# Patient Record
Sex: Female | Born: 2000 | Hispanic: Yes | Marital: Single | Smoking: Never smoker
Health system: Southern US, Community
[De-identification: ages and names within clinical notes are randomized; demographics above are authoritative.]

## PROBLEM LIST (undated history)

## (undated) DIAGNOSIS — R55 Syncope and collapse: Secondary | ICD-10-CM

## (undated) HISTORY — DX: Syncope and collapse: R55

## (undated) HISTORY — PX: WISDOM TOOTH EXTRACTION: SHX21

---
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
## 2019-03-08 DIAGNOSIS — H109 Unspecified conjunctivitis: Secondary | ICD-10-CM | POA: Diagnosis not present

## 2019-03-08 DIAGNOSIS — R1084 Generalized abdominal pain: Secondary | ICD-10-CM | POA: Diagnosis not present

## 2019-04-23 DIAGNOSIS — H5213 Myopia, bilateral: Secondary | ICD-10-CM | POA: Diagnosis not present

## 2019-06-01 ENCOUNTER — Other Ambulatory Visit: Payer: Self-pay

## 2019-06-01 ENCOUNTER — Ambulatory Visit
Admission: EM | Admit: 2019-06-01 | Discharge: 2019-06-01 | Disposition: A | Discharge status: Home / Self Care | Payer: Medicaid Other | Attending: Internal Medicine | Admitting: Internal Medicine

## 2019-06-01 ENCOUNTER — Encounter: Payer: Self-pay | Admitting: Emergency Medicine

## 2019-06-01 DIAGNOSIS — H5789 Other specified disorders of eye and adnexa: Secondary | ICD-10-CM

## 2019-06-01 MED ORDER — TOBRAMYCIN-DEXAMETHASONE 0.3-0.1 % OP SUSP: 1.0000 [drp] | OPHTHALMIC | 0 refills | Status: AC

## 2019-06-01 NOTE — Discharge Instructions (Signed)
Call your eye Dr On Monday to have a follow up.

## 2019-06-01 NOTE — ED Provider Notes (Signed)
[facility]    CSN: [id number] Arrival date & time: 06/01/19  1401      History   Chief Complaint Chief Complaint  Patient presents with  . Eye Problem    left eye    HPI Lauren Torres is a 18 y.o. female. presents with L red eye, tearing and photophobia x 1 week. Her L upper lid started getting swollen 2 days ago. She wears contacts, but was not wearing them when symptoms started. Has not been able to restart her  monthly contact wear due to redness. She felt like she had something in her eye, so she rinsed it and dag around with her finger but did not find anything. Since then her eye has been worse.     History reviewed. No pertinent past medical history.  There are no active problems to display for this patient.   History reviewed. No pertinent surgical history.  OB History   No obstetric history on file.      Home Medications    Prior to Admission medications   Medication Sig Start Date End Date Taking? Authorizing Provider  dicyclomine (BENTYL) 10 MG capsule Take 1 capsule (10 mg total) by mouth 4 (four) times daily. 03/10/17 03/24/17  Rebecka Apley, MD    Family History Family History  Problem Relation Age of Onset  . Healthy Mother   . Healthy Father     Social History Social History   Tobacco Use  . Smoking status: Never Smoker  . Smokeless tobacco: Never Used  Substance Use Topics  . Alcohol use: No  . Drug use: No     Allergies   Patient has no known allergies.   Review of Systems Review of Systems  Constitutional: Negative for chills and fever.  HENT: Positive for rhinorrhea. Negative for congestion.   Eyes: Positive for photophobia, pain, redness and visual disturbance. Negative for discharge and itching.  Musculoskeletal: Negative for gait problem.  Skin: Negative for rash.  Neurological: Negative for headaches.     Physical Exam Triage Vital Signs ED Triage Vitals  Enc Vitals Group     BP 06/01/19 1426  109/72     Pulse Rate 06/01/19 1426 86     Resp 06/01/19 1426 14     Temp 06/01/19 1426 98.4 F (36.9 C)     Temp Source 06/01/19 1426 Oral     SpO2 06/01/19 1426 98 %     Weight 06/01/19 1423 120 lb (54.4 kg)     Height 06/01/19 1423 5\' 2"  (1.575 m)     Head Circumference --      Peak Flow --      Pain Score 06/01/19 1423 6     Pain Loc --      Pain Edu? --      Excl. in GC? --    No data found.  Updated Vital Signs BP 109/72 (BP Location: Left Arm)   Pulse 86   Temp 98.4 F (36.9 C) (Oral)   Resp 14   Ht 5\' 2"  (1.575 m)   Wt 120 lb (54.4 kg)   LMP 05/18/2019 (Approximate)   SpO2 98%   BMI 21.95 kg/m   Visual Acuity Right Eye Distance:   Left Eye Distance:   Bilateral Distance:    Right Eye Near:   Left Eye Near:    Bilateral Near:     Physical Exam Vitals signs reviewed.  Constitutional:      Appearance: Normal appearance. She  is normal weight.  HENT:     Head: Atraumatic.     Right Ear: External ear normal.     Left Ear: External ear normal.  Eyes:     General: No scleral icterus.       Left eye: No foreign body, discharge or hordeolum.     Conjunctiva/sclera:     Left eye: Left conjunctiva is injected.     Pupils: Pupils are equal, round, and reactive to light.     Left eye: No fluorescein uptake.     Slit lamp exam:    Left eye: Photophobia present. No corneal ulcer or foreign body.     Comments: Has perilimbal erythema. Her L eye lid is a little swollen, but not red.   Neck:     Musculoskeletal: Neck supple.  Pulmonary:     Effort: Pulmonary effort is normal.  Musculoskeletal: Normal range of motion.  Skin:    General: Skin is warm and dry.  Neurological:     Mental Status: She is alert and oriented to person, place, and time.  Psychiatric:        Mood and Affect: Mood normal.        Behavior: Behavior normal.        Thought Content: Thought content normal.        Judgment: Judgment normal.      UC Treatments / Results  Labs (all  labs ordered are listed, but only abnormal results are displayed) Labs Reviewed - No data to display  EKG   Radiology No results found.  Procedures Procedures (including critical care time)  Medications Ordered in UC Medications - No data to display  Initial Impression / Assessment and Plan / UC Course  I have reviewed the triage vital signs and the nursing notes. I explained to her I suspect she had iritis and I placed her on Tobradex as noted. Needs to Fu with her eye Dr On Monday. She was educated of the importance of FU with eye MD. Needs to not wear contracts til back to normal.   Final Clinical Impressions(s) / UC Diagnoses   Final diagnoses:  None   Discharge Instructions   None    ED Prescriptions    None     PDMP not reviewed this encounter.   Shelby Mattocks, PA-C 06/01/19 1711

## 2019-06-01 NOTE — ED Triage Notes (Signed)
Patient c/o pain, swelling and redness in her left eye for a week.  Patient denies fevers.  Patient states that she is unable to open her left eye.

## 2019-06-03 DIAGNOSIS — H2000 Unspecified acute and subacute iridocyclitis: Secondary | ICD-10-CM | POA: Diagnosis not present

## 2019-06-05 DIAGNOSIS — H52223 Regular astigmatism, bilateral: Secondary | ICD-10-CM | POA: Diagnosis not present

## 2019-06-10 DIAGNOSIS — H209 Unspecified iridocyclitis: Secondary | ICD-10-CM | POA: Diagnosis not present

## 2019-07-03 DIAGNOSIS — R319 Hematuria, unspecified: Secondary | ICD-10-CM | POA: Diagnosis not present

## 2019-07-03 DIAGNOSIS — N739 Female pelvic inflammatory disease, unspecified: Secondary | ICD-10-CM | POA: Diagnosis not present

## 2019-07-03 DIAGNOSIS — L988 Other specified disorders of the skin and subcutaneous tissue: Secondary | ICD-10-CM | POA: Diagnosis not present

## 2019-07-18 DIAGNOSIS — Z0000 Encounter for general adult medical examination without abnormal findings: Secondary | ICD-10-CM | POA: Diagnosis not present

## 2019-09-22 ENCOUNTER — Other Ambulatory Visit: Payer: Self-pay

## 2019-09-22 ENCOUNTER — Ambulatory Visit
Admission: EM | Admit: 2019-09-22 | Discharge: 2019-09-22 | Disposition: A | Discharge status: Home / Self Care | Payer: Medicaid Other | Attending: Family Medicine | Admitting: Family Medicine

## 2019-09-22 ENCOUNTER — Encounter: Payer: Self-pay | Admitting: Emergency Medicine

## 2019-09-22 DIAGNOSIS — G8918 Other acute postprocedural pain: Secondary | ICD-10-CM | POA: Diagnosis not present

## 2019-09-22 MED ORDER — OXYCODONE-ACETAMINOPHEN 5-325 MG PO TABS: 1.0000 | ORAL_TABLET | Freq: Three times a day (TID) | ORAL | 0 refills | Status: DC | PRN

## 2019-09-22 NOTE — ED Triage Notes (Signed)
Pt c/o mouth pain. Pain is on the right side of her mouth and her jaw. She states about 4 days ago she had her wisdom teeth removed. She is taking tylenol with codeine that she was given by her oral surgeon but is not helping.

## 2019-09-22 NOTE — ED Provider Notes (Signed)
[facility]    CSN: [id number] Arrival date & time: 09/22/19  1401      History   Chief Complaint Chief Complaint  Patient presents with  . Dental Pain   HPI  19 year old female presents with oral pain.  Patient reports that 4 days ago she had wisdom teeth extraction.  She states that she has been experiencing ongoing postoperative pain.  She has been taking Tylenol with codeine as prescribed without significant improvement.  She has called their office and they advised her that if she was not better that should call back and follow-up with them.  She presents today given continued pain.  Rates her pain as 8/10 in severity.  She reports that she is able to drink although she is not eating very much.  No relieving factors.  No other complaints.  PMH: Stomach ulcer, GERD  Past Surgical History:  Procedure Laterality Date  . WISDOM TOOTH EXTRACTION     OB History   No obstetric history on file.    Home Medications    Prior to Admission medications   Medication Sig Start Date End Date Taking? Authorizing Provider  dicyclomine (BENTYL) 10 MG capsule Take 1 capsule (10 mg total) by mouth 4 (four) times daily. 03/10/17 03/24/17  Rebecka Apley, MD  oxyCODONE-acetaminophen (PERCOCET/ROXICET) 5-325 MG tablet Take 1 tablet by mouth every 8 (eight) hours as needed for moderate pain or severe pain. 09/22/19   Tommie Sams, DO    Family History Family History  Problem Relation Age of Onset  . Healthy Mother   . Healthy Father     Social History Social History   Tobacco Use  . Smoking status: Never Smoker  . Smokeless tobacco: Never Used  Substance Use Topics  . Alcohol use: No  . Drug use: No   Allergies   Patient has no known allergies.  Review of Systems Review of Systems  Constitutional: Positive for appetite change.  HENT:       Oral pain.   Physical Exam Triage Vital Signs ED Triage Vitals  Enc Vitals Group     BP 09/22/19 1418 120/84   Pulse Rate 09/22/19 1418 (!) 101     Resp 09/22/19 1418 18     Temp 09/22/19 1418 100.2 F (37.9 C)     Temp Source 09/22/19 1418 Oral     SpO2 09/22/19 1418 99 %     Weight 09/22/19 1415 122 lb (55.3 kg)     Height 09/22/19 1415 5\' 2"  (1.575 m)     Head Circumference --      Peak Flow --      Pain Score 09/22/19 1415 8     Pain Loc --      Pain Edu? --      Excl. in GC? --    Updated Vital Signs BP 120/84 (BP Location: Left Arm)   Pulse (!) 101   Temp 100.2 F (37.9 C) (Oral)   Resp 18   Ht 5\' 2"  (1.575 m)   Wt 55.3 kg   LMP 09/03/2019 (Approximate)   SpO2 99%   BMI 22.31 kg/m   Visual Acuity Right Eye Distance:   Left Eye Distance:   Bilateral Distance:    Right Eye Near:   Left Eye Near:    Bilateral Near:     Physical Exam Vitals and nursing note reviewed.  Constitutional:      Appearance: She is not toxic-appearing.     Comments: Appears  fatigued.  HENT:     Head: Normocephalic and atraumatic.     Mouth/Throat:     Mouth: Mucous membranes are moist.     Comments: No evidence of dental/oral abscess or infection. Eyes:     General:        Right eye: No discharge.        Left eye: No discharge.     Conjunctiva/sclera: Conjunctivae normal.  Cardiovascular:     Rate and Rhythm: Regular rhythm. Tachycardia present.  Pulmonary:     Effort: Pulmonary effort is normal.     Breath sounds: Normal breath sounds. No wheezing, rhonchi or rales.  Neurological:     Mental Status: She is alert.  Psychiatric:        Mood and Affect: Mood normal.        Behavior: Behavior normal.    UC Treatments / Results  Labs (all labs ordered are listed, but only abnormal results are displayed) Labs Reviewed - No data to display  EKG   Radiology No results found.  Procedures Procedures (including critical care time)  Medications Ordered in UC Medications - No data to display  Initial Impression / Assessment and Plan / UC Course  I have reviewed the triage vital  signs and the nursing notes.  Pertinent labs & imaging results that were available during my care of the patient were reviewed by me and considered in my medical decision making (see chart for details).    19 year old female presents with postoperative pain.  Patient status post recent wisdom tooth extraction.  Persistent pain despite Tylenol 3.  Stopping.  Starting on oxycodone.  Advised to call her surgeon's office tomorrow.  I called the office today and there was no evident way to reach anyone.  Unsure if anyone is on-call.  Advised close to follow-up.  [place] controlled substance database reviewed.  No concerns for abuse at this time.  Final Clinical Impressions(s) / UC Diagnoses   Final diagnoses:  Post-operative pain     Discharge Instructions     Hydrate.  Stop Tylenol # 3.  Oxycodone as needed.  Call surgeon in the AM.  Take care  Dr. Lacinda Axon    ED Prescriptions    Medication Sig [place]. Provider   oxyCODONE-acetaminophen (PERCOCET/ROXICET) 5-325 MG tablet Take 1 tablet by mouth every 8 (eight) hours as needed for moderate pain or severe pain. 10 tablet Thersa Salt G, DO     I have reviewed the PDMP during this encounter.   Coral Spikes, [place] 09/22/19 1513

## 2019-09-22 NOTE — Discharge Instructions (Signed)
Hydrate.  Stop Tylenol # 3.  Oxycodone as needed.  Call surgeon in the AM.  Take care  Dr. Adriana Simas

## 2019-11-18 DIAGNOSIS — Z0000 Encounter for general adult medical examination without abnormal findings: Secondary | ICD-10-CM | POA: Diagnosis not present

## 2019-12-23 DIAGNOSIS — Z03818 Encounter for observation for suspected exposure to other biological agents ruled out: Secondary | ICD-10-CM | POA: Diagnosis not present

## 2020-01-13 DIAGNOSIS — Z20828 Contact with and (suspected) exposure to other viral communicable diseases: Secondary | ICD-10-CM | POA: Diagnosis not present

## 2020-01-13 DIAGNOSIS — Z03818 Encounter for observation for suspected exposure to other biological agents ruled out: Secondary | ICD-10-CM | POA: Diagnosis not present

## 2020-03-04 ENCOUNTER — Ambulatory Visit
Admission: EM | Admit: 2020-03-04 | Discharge: 2020-03-04 | Disposition: A | Discharge status: Home / Self Care | Payer: Medicaid Other | Attending: Emergency Medicine | Admitting: Emergency Medicine

## 2020-03-04 ENCOUNTER — Other Ambulatory Visit: Payer: Self-pay

## 2020-03-04 ENCOUNTER — Encounter: Payer: Self-pay | Admitting: Emergency Medicine

## 2020-03-04 DIAGNOSIS — Z20822 Contact with and (suspected) exposure to covid-19: Secondary | ICD-10-CM | POA: Insufficient documentation

## 2020-03-04 DIAGNOSIS — J029 Acute pharyngitis, unspecified: Secondary | ICD-10-CM

## 2020-03-04 DIAGNOSIS — B349 Viral infection, unspecified: Secondary | ICD-10-CM | POA: Diagnosis not present

## 2020-03-04 LAB — SARS CORONAVIRUS 2 (TAT 6-24 HRS): SARS Coronavirus 2: NEGATIVE

## 2020-03-04 LAB — GROUP A STREP BY PCR: Group A Strep by PCR: NOT DETECTED

## 2020-03-04 LAB — MONONUCLEOSIS SCREEN: Mono Screen: NEGATIVE

## 2020-03-04 MED ORDER — LIDOCAINE VISCOUS HCL 2 % MT SOLN: 15.0000 mL | Freq: Three times a day (TID) | OROMUCOSAL | 0 refills | Status: DC | PRN

## 2020-03-04 MED ORDER — PREDNISONE 20 MG PO TABS: 40.0000 mg | ORAL_TABLET | Freq: Every day | ORAL | 0 refills | Status: DC

## 2020-03-04 NOTE — Discharge Instructions (Signed)
Take medication as prescribed. Rest. Drink plenty of fluids. Monitor.   Follow up with your primary care physician this week as needed. Return to Urgent care or ER for chest pain, shortness of breath, difficulty swallowing, new or worsening concerns.

## 2020-03-04 NOTE — ED Provider Notes (Addendum)
[facility] ____________________________________________  Time seen: Approximately 2:13 PM  I have reviewed the triage vital signs and the nursing notes.   HISTORY  Chief Complaint Headache and Sore Throat  HPI Lauren Torres is a 19 y.o. female present for evaluation of sore throat for the last 3 days.  Reports woke up with sore throat, congestion and some coughing on Sunday and symptoms have continued.  States she has had painful swallowing and not eating quite as much.  Some intermittent lightheadedness.  Overall continues to drink fluids well.  States nasal congestion and runny nose.  Has had occasional episode of chest pain that last for a few seconds and then resolves.  Denies any current chest pain.  Denies shortness of breath.  Denies known sick contacts.  Does not share drinks after others.  Denies known fevers.  Has had both COVID-19 vaccines, last in February.  Denies other aggravating alleviating factors.  Denies recent sickness.  Patient's last menstrual period was 02/13/2020. Denies pregnancy.  Nira Retort : PCP    History reviewed. No pertinent past medical history.  There are no problems to display for this patient.   Past Surgical History:  Procedure Laterality Date   WISDOM TOOTH EXTRACTION       No current facility-administered medications for this encounter.  Current Outpatient Medications:    lidocaine (XYLOCAINE) 2 % solution, Use as directed 15 mLs in the mouth or throat every 8 (eight) hours as needed (sore throat. gargle and spit as needed for sore throat.)., Disp: 100 mL, Rfl: 0   predniSONE (DELTASONE) 20 MG tablet, Take 2 tablets (40 mg total) by mouth daily., Disp: 10 tablet, Rfl: 0  Allergies Patient has no known allergies.  Family History  Problem Relation Age of Onset   Healthy Mother    Healthy Father     Social History Social History   Tobacco Use   Smoking status: Never Smoker   Smokeless tobacco:  Never Used  Building services engineer Use: Never used  Substance Use Topics   Alcohol use: No   Drug use: No    Review of Systems Constitutional: No fever. ENT: Positive sore throat. Positive congestion.  Cardiovascular:AS above.  Respiratory: Denies shortness of breath. Gastrointestinal: No abdominal pain.  No nausea, no vomiting.  No diarrhea.   Skin: Negative for rash.  ____________________________________________   PHYSICAL EXAM:  VITAL SIGNS: ED Triage Vitals  Enc Vitals Group     BP 03/04/20 1325 108/81     Pulse Rate 03/04/20 1325 64     Resp 03/04/20 1325 18     Temp 03/04/20 1325 98.1 F (36.7 C)     Temp Source 03/04/20 1325 Oral     SpO2 03/04/20 1325 100 %     Weight 03/04/20 1321 128 lb (58.1 kg)     Height 03/04/20 1321 5\' 3"  (1.6 m)     Head Circumference --      Peak Flow --      Pain Score 03/04/20 1321 6     Pain Loc --      Pain Edu? --      Excl. in GC? --     Constitutional: Alert and oriented. Well appearing and in no acute distress. Eyes: Conjunctivae are normal.  Head: Atraumatic. No swelling. No erythema.  Ears: no erythema, normal TMs bilaterally.   Nose:Nasal congestion  Mouth/Throat: Mucous membranes are moist. Mild pharyngeal erythema. 2-3+ bilateral tonsillar swelling.  No exudate.  No  uvular shift or deviation. Neck: No stridor.  No cervical spine tenderness to palpation. Hematological/Lymphatic/Immunilogical: Anterior bilateral cervical lymphadenopathy. Cardiovascular: Normal rate, regular rhythm. Grossly normal heart sounds. Good peripheral circulation. Respiratory: Normal respiratory effort. No retractions. No wheezes, rales or rhonchi. Good air movement.  Musculoskeletal: Ambulatory with steady gait.  Neurologic:  Normal speech and language. No gait instability. Skin:  Skin appears warm, dry and intact. No rash noted. Psychiatric: Mood and affect are normal. Speech and behavior are  normal.  ___________________________________________   LABS (all labs ordered are listed, but only abnormal results are displayed)  Labs Reviewed  GROUP A STREP BY PCR  SARS CORONAVIRUS 2 (TAT 6-24 HRS)  MONONUCLEOSIS SCREEN   ____________________________________________  EKG  ED ECG REPORT I, Renford Dills, the attending physician, personally viewed and interpreted this ECG.   Date: 03/04/2020  EKG Time:1416  Rate: 60  Rhythm: Normal sinus rhythm.  ST&T Change: No ST elevation noted.  RADIOLOGY  No results found. ____________________________________________   PROCEDURES Procedures    INITIAL IMPRESSION / ASSESSMENT AND PLAN / ED COURSE  Pertinent labs & imaging results that were available during my care of the patient were reviewed by me and considered in my medical decision making (see chart for details).  Overall well-appearing patient.  No acute distress.  Suspect viral illness.  Strep negative.  COVID-19 testing ordered.  Mono negative.  Suspect viral.  Supportive care with prednisone and as needed viscous lidocaine gargles.  Over-the-counter cough congestion medication as needed.  Rest.  Work note given for today and tomorrow pending negative Covid test.  Discussed very strict follow-up and return parameters including chest pain, shortness of breath, difficulty swallowing or worsening concerns.Discussed indication, risks and benefits of medications with patient.   Discussed follow up with Primary care physician this week. Discussed follow up and return parameters including no resolution or any worsening concerns. Patient verbalized understanding and agreed to plan.   ____________________________________________   FINAL CLINICAL IMPRESSION(S) / ED DIAGNOSES  Final diagnoses:  Pharyngitis, unspecified etiology  Viral illness     ED Discharge Orders         Ordered    lidocaine (XYLOCAINE) 2 % solution  Every 8 hours PRN     Discontinue  Reprint      03/04/20 1454    predniSONE (DELTASONE) 20 MG tablet  Daily     Discontinue  Reprint     03/04/20 1454           Note: This dictation was prepared with Dragon dictation along with smaller phrase technology. Any transcriptional errors that result from this process are unintentional.         Renford Dills, NP 03/04/20 1501

## 2020-03-04 NOTE — ED Triage Notes (Signed)
Pt c/o headache, sore throat and dizziness. Started about 3 days ago. Denies fever.

## 2020-03-14 DIAGNOSIS — Z03818 Encounter for observation for suspected exposure to other biological agents ruled out: Secondary | ICD-10-CM | POA: Diagnosis not present

## 2020-03-14 DIAGNOSIS — Z20822 Contact with and (suspected) exposure to covid-19: Secondary | ICD-10-CM | POA: Diagnosis not present

## 2021-01-11 ENCOUNTER — Other Ambulatory Visit: Payer: Self-pay

## 2021-01-11 ENCOUNTER — Ambulatory Visit (LOCAL_COMMUNITY_HEALTH_CENTER): Payer: Self-pay

## 2021-01-11 DIAGNOSIS — Z111 Encounter for screening for respiratory tuberculosis: Secondary | ICD-10-CM

## 2021-01-11 NOTE — Progress Notes (Signed)
Patient reports ~2 weeks ago had PPD at [facility]. She stated they told her results were inconclusive???  Here today for repeat PPD.  Per [facility] EMR 11/12/2018 had 98mm reading at [facility]. Richmond Campbell, RN

## 2021-01-14 ENCOUNTER — Ambulatory Visit (LOCAL_COMMUNITY_HEALTH_CENTER): Payer: Medicaid Other

## 2021-01-14 ENCOUNTER — Other Ambulatory Visit: Payer: Self-pay

## 2021-01-14 DIAGNOSIS — Z111 Encounter for screening for respiratory tuberculosis: Secondary | ICD-10-CM

## 2021-01-14 LAB — TB SKIN TEST
Induration: 0 mm
TB Skin Test: NEGATIVE

## 2021-03-03 ENCOUNTER — Other Ambulatory Visit: Payer: Self-pay

## 2021-03-03 ENCOUNTER — Ambulatory Visit
Admission: EM | Admit: 2021-03-03 | Discharge: 2021-03-03 | Disposition: A | Discharge status: Home / Self Care | Payer: Medicaid Other | Attending: Sports Medicine | Admitting: Sports Medicine

## 2021-03-03 DIAGNOSIS — W57XXXA Bitten or stung by nonvenomous insect and other nonvenomous arthropods, initial encounter: Secondary | ICD-10-CM

## 2021-03-03 DIAGNOSIS — R220 Localized swelling, mass and lump, head: Secondary | ICD-10-CM | POA: Diagnosis not present

## 2021-03-03 DIAGNOSIS — S00561A Insect bite (nonvenomous) of lip, initial encounter: Secondary | ICD-10-CM

## 2021-03-03 DIAGNOSIS — B001 Herpesviral vesicular dermatitis: Secondary | ICD-10-CM | POA: Diagnosis not present

## 2021-03-03 DIAGNOSIS — T7840XA Allergy, unspecified, initial encounter: Secondary | ICD-10-CM | POA: Diagnosis not present

## 2021-03-03 MED ORDER — METHYLPREDNISOLONE SODIUM SUCC 40 MG IJ SOLR
80.0000 mg | Freq: Once | INTRAMUSCULAR | Status: AC
  Administered 2021-03-03: 80 mg via INTRAMUSCULAR

## 2021-03-03 MED ORDER — PREDNISONE 5 MG PO TABS: 5.0000 mg | ORAL_TABLET | Freq: Every day | ORAL | 0 refills | Status: DC

## 2021-03-03 MED ORDER — ACYCLOVIR 5 % EX OINT: 1.0000 "application " | TOPICAL_OINTMENT | CUTANEOUS | 0 refills | Status: DC

## 2021-03-03 NOTE — Discharge Instructions (Addendum)
As we discussed, you have a swollen lip that may be secondary to an insect bite.  I have treated you with a steroid injection today.  I also called in a steroid taper that you will start tomorrow and you can get from your pharmacy to which she will take orally. This may be a viral process or a cold sore.  I have sent in a topical ointment that you can use over that area as directed. Please follow-up with your primary care provider if symptoms persist. Please see educational handouts. If symptoms worsen or you develop any chest pain, shortness of breath, throat tightening, or any other concerning symptoms then please call 911 or go to the ER.

## 2021-03-03 NOTE — ED Triage Notes (Signed)
Pt states she was initially bit by unknown insect on upper lip 4 days ago.  Has applied cortisone creme and ice but continues to swell.  Feels some burning and stinging on upper lip.  Swelling limited to lip, no mouth or airway involvement. Some pain with talking or drinking/eating.

## 2021-03-04 NOTE — ED Provider Notes (Signed)
[facility]    CSN: [id number] Arrival date & time: 03/03/21  1129      History   Chief Complaint Chief Complaint  Patient presents with   Insect Bite    HPI Lauren Torres is a 20 y.o. female.   Patient is a 20 year old female who presents for evaluation of upper lip swelling for about 4 days now.  She reports that she felt a sting when she was putting on her mask on February 27, 2021.  She did not see an insect or a wasp or bee sting her.  There was no actual witnessed event.  She has been using some topical hydrocortisone cream and icing but the swelling continues.  She feels a little bit of burning and stinging in her upper lip.  She denies any sore throat, difficulty breathing, wheezing, or difficulty swallowing.  No fever shakes chills.  No nausea vomiting or diarrhea.  No history of asthma or seasonal allergies.  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  Her primary care physician is [facility] in [place] but she did not get a chance to get into see them.  No chest pain or shortness of breath.  No red flag signs or symptoms.   History reviewed. No pertinent past medical history.  There are no problems to display for this patient.   Past Surgical History:  Procedure Laterality Date   WISDOM TOOTH EXTRACTION      OB History   No obstetric history on file.      Home Medications    Prior to Admission medications   Medication Sig Start Date End Date Taking? Authorizing Provider  acyclovir ointment (ZOVIRAX) 5 % Apply 1 application topically every 3 (three) hours. 03/03/21  Yes Delton See, MD  predniSONE (DELTASONE) 5 MG tablet Take 1 tablet (5 mg total) by mouth daily with breakfast. 6 tablets on day 1, 5 tablets on day 2, 4 tablets on day 3, 3 tablets on day 4, 2 tablets on day 5, and 1 tablet on day 6.  Do not start until March 04, 2021 03/03/21  Yes Delton See, MD  lidocaine (XYLOCAINE) 2 % solution Use as  directed 15 mLs in the mouth or throat every 8 (eight) hours as needed (sore throat. gargle and spit as needed for sore throat.). 03/04/20   Renford Dills, NP  dicyclomine (BENTYL) 10 MG capsule Take 1 capsule (10 mg total) by mouth 4 (four) times daily. 03/10/17 03/04/20  Rebecka Apley, MD    Family History Family History  Problem Relation Age of Onset   Healthy Mother    Healthy Father     Social History Social History   Tobacco Use   Smoking status: Never   Smokeless tobacco: Never  Vaping Use   Vaping Use: Never used  Substance Use Topics   Alcohol use: No   Drug use: No     Allergies   Patient has no known allergies.   Review of Systems Review of Systems  Constitutional:  Negative for activity change, appetite change, chills, diaphoresis, fatigue and fever.  HENT:  Positive for facial swelling. Negative for congestion, ear pain, postnasal drip, rhinorrhea, sinus pressure, sinus pain, sneezing and sore throat.        Swelling of the upper lip without evidence of angioedema.  Eyes:  Negative for pain.  Respiratory:  Negative for cough, chest tightness and shortness of breath.   Cardiovascular:  Negative for chest pain and palpitations.  Gastrointestinal:  Negative for abdominal pain, diarrhea, nausea and vomiting.  Genitourinary:  Negative for dysuria.  Musculoskeletal:  Negative for back pain, myalgias and neck pain.  Skin:  Negative for color change, pallor, rash and wound.  Neurological:  Positive for numbness. Negative for dizziness, light-headedness and headaches.  All other systems reviewed and are negative.   Physical Exam Triage Vital Signs ED Triage Vitals  Enc Vitals Group     BP 03/03/21 1203 104/75     Pulse Rate 03/03/21 1203 68     Resp 03/03/21 1203 18     Temp 03/03/21 1203 (!) 97.5 F (36.4 C)     Temp Source 03/03/21 1203 Oral     SpO2 03/03/21 1203 97 %     Weight --      Height --      Head Circumference --      Peak Flow --       Pain Score 03/03/21 1201 0     Pain Loc --      Pain Edu? --      Excl. in GC? --    No data found.  Updated Vital Signs BP 104/75 (BP Location: Left Arm)   Pulse 68   Temp (!) 97.5 F (36.4 C) (Oral) Comment: difficulty holding in mouth d/t lip swelling/pain  Resp 18   LMP 02/08/2021 (Exact Date)   SpO2 97%   Visual Acuity Right Eye Distance:   Left Eye Distance:   Bilateral Distance:    Right Eye Near:   Left Eye Near:    Bilateral Near:     Physical Exam Vitals and nursing note reviewed.  Constitutional:      General: She is not in acute distress.    Appearance: Normal appearance. She is not ill-appearing, toxic-appearing or diaphoretic.  HENT:     Head: Normocephalic and atraumatic.     Comments: Patient has some swelling of the upper lip in the midline.  There is some tenderness to palpation.  No evidence of angioedema.  It is consistent with an insect bite or sting.  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    Nose: Nose normal.     Mouth/Throat:     Mouth: Mucous membranes are moist.  Eyes:     General: No scleral icterus.       Right eye: No discharge.        Left eye: No discharge.     Extraocular Movements: Extraocular movements intact.     Conjunctiva/sclera: Conjunctivae normal.     Pupils: Pupils are equal, round, and reactive to light.  Cardiovascular:     Rate and Rhythm: Normal rate and regular rhythm.     Pulses: Normal pulses.     Heart sounds: Normal heart sounds. No murmur heard.   No friction rub. No gallop.  Pulmonary:     Effort: Pulmonary effort is normal.     Breath sounds: Normal breath sounds. No stridor. No wheezing, rhonchi or rales.  Musculoskeletal:     Cervical back: Normal range of motion and neck supple.  Skin:    General: Skin is warm and dry.     Capillary Refill: Capillary refill takes less than 2 seconds.  Neurological:     General: No focal deficit present.      Mental Status: She is alert and oriented to person, place, and time.  UC Treatments / Results  Labs (all labs ordered are listed, but only abnormal results are displayed) Labs Reviewed - No data to display  EKG   Radiology No results found.  Procedures Procedures (including critical care time)  Medications Ordered in UC Medications  methylPREDNISolone sodium succinate (SOLU-MEDROL) 40 mg/mL injection 80 mg (80 mg Intramuscular Given 03/03/21 1247)    Initial Impression / Assessment and Plan / UC Course  I have reviewed the triage vital signs and the nursing notes.  Pertinent labs & imaging results that were available during my care of the patient were reviewed by me and considered in my medical decision making (see chart for details).  Clinical impression: 1.  Insect bite to the upper lip with swelling. 2.  Allergic reaction 3.  Cold sore  Treatment plan: 1.  The findings and treatment plan were discussed in detail with the patient.  Patient was in agreement. 2.  Micah Flesher ahead and treated her with a steroid injection here in the office today.  Gave her 80 mg of IM Solu-Medrol.  She tolerated this without incident. 3.  She will start oral steroids tomorrow and give her a Dosepak. 4.  For the cold sore I did prescribe acyclovir ointment as scheduled. 5.  Educational handouts provided. 6.  Over-the-counter meds as needed. 7.  If symptoms persist I have asked her to see her PCP. 8.  If she developed worsening symptoms including chest pain, shortness of breath, throat tightening or any other concerning symptoms that she needed to call 911 and go to the ER.  She voiced verbal understanding. 9.  She was stable upon discharge and will follow-up here as needed.    Final Clinical Impressions(s) / UC Diagnoses   Final diagnoses:  Insect bite of lip, initial encounter  Swelling of upper lip  Allergic reaction, initial encounter  Cold sore     Discharge Instructions      As  we discussed, you have a swollen lip that may be secondary to an insect bite.  I have treated you with a steroid injection today.  I also called in a steroid taper that you will start tomorrow and you can get from your pharmacy to which she will take orally. This may be a viral process or a cold sore.  I have sent in a topical ointment that you can use over that area as directed. Please follow-up with your primary care provider if symptoms persist. Please see educational handouts. If symptoms worsen or you develop any chest pain, shortness of breath, throat tightening, or any other concerning symptoms then please call 911 or go to the ER.     ED Prescriptions     Medication Sig Dispense Auth. Provider   acyclovir ointment (ZOVIRAX) 5 % Apply 1 application topically every 3 (three) hours. 15 g Delton See, MD   predniSONE (DELTASONE) 5 MG tablet Take 1 tablet (5 mg total) by mouth daily with breakfast. 6 tablets on day 1, 5 tablets on day 2, 4 tablets on day 3, 3 tablets on day 4, 2 tablets on day 5, and 1 tablet on day 6.  Do not start until March 04, 2021 21 tablet Delton See, MD      PDMP not reviewed this encounter.   Delton See, MD 03/04/21 1950

## 2021-05-20 ENCOUNTER — Encounter: Payer: Self-pay | Admitting: Emergency Medicine

## 2021-05-20 ENCOUNTER — Other Ambulatory Visit: Payer: Self-pay

## 2021-05-20 ENCOUNTER — Ambulatory Visit
Admission: EM | Admit: 2021-05-20 | Discharge: 2021-05-20 | Disposition: A | Discharge status: Home / Self Care | Payer: Medicaid Other | Attending: Emergency Medicine | Admitting: Emergency Medicine

## 2021-05-20 DIAGNOSIS — U071 COVID-19: Secondary | ICD-10-CM

## 2021-05-20 LAB — URINALYSIS, COMPLETE (UACMP) WITH MICROSCOPIC
Bilirubin Urine: NEGATIVE
Glucose, UA: NEGATIVE mg/dL
Ketones, ur: NEGATIVE mg/dL
Leukocytes,Ua: NEGATIVE
Nitrite: NEGATIVE
Protein, ur: NEGATIVE mg/dL
Specific Gravity, Urine: 1.02 (ref 1.005–1.030)
pH: 7.5 (ref 5.0–8.0)

## 2021-05-20 LAB — RESP PANEL BY RT-PCR (FLU A&B, COVID) ARPGX2
Influenza A by PCR: NEGATIVE
Influenza B by PCR: NEGATIVE
SARS Coronavirus 2 by RT PCR: POSITIVE — AB

## 2021-05-20 LAB — GROUP A STREP BY PCR: Group A Strep by PCR: NOT DETECTED

## 2021-05-20 MED ORDER — IBUPROFEN 600 MG PO TABS: 600.0000 mg | ORAL_TABLET | Freq: Four times a day (QID) | ORAL | 0 refills | Status: DC | PRN

## 2021-05-20 MED ORDER — BENZONATATE 200 MG PO CAPS: 200.0000 mg | ORAL_CAPSULE | Freq: Three times a day (TID) | ORAL | 0 refills | Status: DC | PRN

## 2021-05-20 MED ORDER — ACETAMINOPHEN 325 MG PO TABS
650.0000 mg | ORAL_TABLET | Freq: Once | ORAL | Status: AC
  Administered 2021-05-20: 650 mg via ORAL

## 2021-05-20 NOTE — ED Provider Notes (Signed)
HPI  SUBJECTIVE:  Lauren Torres is a 20 y.o. female who presents with 5 days of sore throat.  She reports fevers to 101.5 starting today with headache, body aches, nasal congestion, rhinorrhea, cough, shortness of breath, nausea, mild abdominal pain.  She reports a raspy voice.  No loss of sense of smell or taste, vomiting, diarrhea, rash, drooling, trismus, sensation of throat swelling shut, neck stiffness.  He was exposed to strep 6 days ago.  No known flu or COVID exposure.  She got the COVID booster.  She also states that urination "feels weird" and reports cloudy urine for the past 2 days.  No other urinary complaints.  No antibiotics in the past month.  No antipyretic in the past 6 hours.  She tried tea which made her sore throat worse.  No alleviating factors.  Sore throat is worse with talking and swallowing.  She has a past medical history of UTI, COVID in January 2022.  LMP: 9/11.  Denies the possibility of being pregnant.  PMD: [facility] [place]   History reviewed. No pertinent past medical history.  Past Surgical History:  Procedure Laterality Date   WISDOM TOOTH EXTRACTION      Family History  Problem Relation Age of Onset   Healthy Mother    Healthy Father     Social History   Tobacco Use   Smoking status: Never   Smokeless tobacco: Never  Vaping Use   Vaping Use: Never used  Substance Use Topics   Alcohol use: No   Drug use: No    No current facility-administered medications for this encounter.  Current Outpatient Medications:    benzonatate (TESSALON) 200 MG capsule, Take 1 capsule (200 mg total) by mouth 3 (three) times daily as needed for cough., Disp: 30 capsule, Rfl: 0   ibuprofen (ADVIL) 600 MG tablet, Take 1 tablet (600 mg total) by mouth every 6 (six) hours as needed., Disp: 30 tablet, Rfl: 0  No Known Allergies   ROS  As noted in HPI.   Physical Exam  BP 109/74 (BP Location: Right Arm)   Pulse (!) 120   Temp (!) 101.5 F (38.6 C)  (Oral)   Resp 18   LMP 05/09/2021   SpO2 97%   Constitutional: Well developed, well nourished, no acute distress Eyes: PERRL, EOMI, conjunctiva normal bilaterally HENT: Normocephalic, atraumatic,mucus membranes moist.  Erythematous, swollen, almost touching tonsils without exudates.  Airway patent.  No muffled voice, drooling, trismus, stridor.  Erythematous, swollen turbinates right side.  Positive nasal congestion right side.  Positive frontal, maxillary sinus tenderness. Neck: Positive anterior cervical adenopathy Respiratory: Clear to auscultation bilaterally, no rales, no wheezing, no rhonchi Cardiovascular: Regular tachycardia, no murmurs, no gallops, no rubs GI: Soft, nondistended, normal bowel sounds, positive suprapubic, flank tenderness, no rebound, no guarding.  No splenomegaly Back: Questionable left CVAT skin: No rash, skin intact Musculoskeletal: No edema, no tenderness, no deformities Neurologic: Alert & oriented x 3, CN III-XII grossly intact, no motor deficits, sensation grossly intact Psychiatric: Speech and behavior appropriate   ED Course   Medications  acetaminophen (TYLENOL) tablet 650 mg (650 mg Oral Given 05/20/21 1228)    Orders Placed This Encounter  Procedures   Resp Panel by RT-PCR (Flu A&B, Covid) Nasopharyngeal Swab    Standing Status:   Standing    Number of Occurrences:   1    Order Specific Question:   Is this test for diagnosis or screening    Answer:   Diagnosis  of ill patient    Order Specific Question:   Symptomatic for COVID-19 as defined by CDC    Answer:   Yes    Order Specific Question:   Date of Symptom Onset    Answer:   05/16/2021    Order Specific Question:   Hospitalized for COVID-19    Answer:   No    Order Specific Question:   Admitted to ICU for COVID-19    Answer:   No    Order Specific Question:   Previously tested for COVID-19    Answer:   Unknown    Order Specific Question:   Resident in a congregate (group) care setting     Answer:   No    Order Specific Question:   Employed in healthcare setting    Answer:   No    Order Specific Question:   Pregnant    Answer:   No    Order Specific Question:   Has patient completed COVID vaccination(s) (2 doses of Pfizer/Moderna 1 dose of [employer])    Answer:   Yes    Order Specific Question:   Has patient completed COVID Booster / 3rd dose    Answer:   Yes   Group A Strep by PCR    Standing Status:   Standing    Number of Occurrences:   1   Urinalysis, Complete w Microscopic Urine, Clean Catch    Standing Status:   Standing    Number of Occurrences:   1   Airborne and Contact precautions    Standing Status:   Standing    Number of Occurrences:   1   Results for orders placed or performed during the hospital encounter of 05/20/21 (from the past 24 hour(s))  Resp Panel by RT-PCR (Flu A&B, Covid) Nasopharyngeal Swab     Status: Abnormal   Collection Time: 05/20/21 12:24 PM   Specimen: Nasopharyngeal Swab; Nasopharyngeal(NP) swabs in vial transport medium  Result Value Ref Range   SARS Coronavirus 2 by RT PCR POSITIVE (A) NEGATIVE   Influenza A by PCR NEGATIVE NEGATIVE   Influenza B by PCR NEGATIVE NEGATIVE  Group A Strep by PCR     Status: None   Collection Time: 05/20/21 12:35 PM   Specimen: Throat; Sterile Swab  Result Value Ref Range   Group A Strep by PCR NOT DETECTED NOT DETECTED  Urinalysis, Complete w Microscopic Urine, Clean Catch     Status: Abnormal   Collection Time: 05/20/21 12:46 PM  Result Value Ref Range   Color, Urine YELLOW YELLOW   APPearance CLEAR CLEAR   Specific Gravity, Urine 1.020 1.005 - 1.030   pH 7.5 5.0 - 8.0   Glucose, UA NEGATIVE NEGATIVE mg/dL   Hgb urine dipstick SMALL (A) NEGATIVE   Bilirubin Urine NEGATIVE NEGATIVE   Ketones, ur NEGATIVE NEGATIVE mg/dL   Protein, ur NEGATIVE NEGATIVE mg/dL   Nitrite NEGATIVE NEGATIVE   Leukocytes,Ua NEGATIVE NEGATIVE   Squamous Epithelial / LPF 6-10 0 - 5   WBC, UA 0-5 0 - 5  WBC/hpf   RBC / HPF 6-10 0 - 5 RBC/hpf   Bacteria, UA RARE (A) NONE SEEN   No results found.  ED Clinical Impression  1. COVID-19 virus infection      ED Assessment/Plan  Sore throat.  Checking strep PCR, COVID, flu because of the fever.  On further history, she has some urinary complaints and she has suprapubic, flank tenderness.  Also checking a  urine.  COVID-positive.  Strep negative.  Contaminated urine specimen, but does not appear to be a UTI.  She has no comorbidities and does not qualify for antiviral treatment unfortunately.  Home with Tylenol/ibuprofen and Tessalon.  Discussed labs, MDM, treatment plan, and plan for follow-up with patient Discussed sn/sx that should prompt return to the ED. patient agrees with plan.   Meds ordered this encounter  Medications   acetaminophen (TYLENOL) tablet 650 mg   benzonatate (TESSALON) 200 MG capsule    Sig: Take 1 capsule (200 mg total) by mouth 3 (three) times daily as needed for cough.    Dispense:  30 capsule    Refill:  0   ibuprofen (ADVIL) 600 MG tablet    Sig: Take 1 tablet (600 mg total) by mouth every 6 (six) hours as needed.    Dispense:  30 tablet    Refill:  0     *This clinic note was created using Scientist, clinical (histocompatibility and immunogenetics). Therefore, there may be occasional mistakes despite careful proofreading. ?    Domenick Gong, MD 05/20/21 [phone]

## 2021-05-20 NOTE — ED Triage Notes (Signed)
Pt c/o nasal congestion, headache, sore throat cough and body aches x 5 days.

## 2021-05-20 NOTE — Discharge Instructions (Addendum)
Your COVID was positive.  Your flu, strep, urine were all negative.  You may take 600 mg of ibuprofen combined with 1000 mg of Tylenol together 3-4 times a day as needed for pain, fevers, body aches, headaches.  Continue pushing plenty of electrolyte containing fluids such as Pedialyte or Gatorade.  Tessalon for the cough.

## 2021-11-18 ENCOUNTER — Other Ambulatory Visit: Payer: Self-pay

## 2021-11-18 ENCOUNTER — Ambulatory Visit
Admission: EM | Admit: 2021-11-18 | Discharge: 2021-11-18 | Disposition: A | Discharge status: Home / Self Care | Payer: Medicaid Other | Attending: Internal Medicine | Admitting: Internal Medicine

## 2021-11-18 ENCOUNTER — Encounter: Payer: Self-pay | Admitting: Emergency Medicine

## 2021-11-18 DIAGNOSIS — N3000 Acute cystitis without hematuria: Secondary | ICD-10-CM | POA: Diagnosis not present

## 2021-11-18 LAB — URINALYSIS, ROUTINE W REFLEX MICROSCOPIC
Bilirubin Urine: NEGATIVE
Glucose, UA: NEGATIVE mg/dL
Ketones, ur: NEGATIVE mg/dL
Nitrite: NEGATIVE
Protein, ur: NEGATIVE mg/dL
Specific Gravity, Urine: 1.025 (ref 1.005–1.030)
pH: 7 (ref 5.0–8.0)

## 2021-11-18 LAB — URINALYSIS, MICROSCOPIC (REFLEX)

## 2021-11-18 LAB — PREGNANCY, URINE: Preg Test, Ur: NEGATIVE

## 2021-11-18 MED ORDER — NITROFURANTOIN MONOHYD MACRO 100 MG PO CAPS: 100.0000 mg | ORAL_CAPSULE | Freq: Two times a day (BID) | ORAL | 0 refills | Status: DC

## 2021-11-18 MED ORDER — PHENAZOPYRIDINE HCL 200 MG PO TABS: 200.0000 mg | ORAL_TABLET | Freq: Three times a day (TID) | ORAL | 0 refills | Status: DC

## 2021-11-18 NOTE — ED Triage Notes (Signed)
Pt c/o dysuria, and urinary frequency. Started about 3 days ago.  ?

## 2021-11-18 NOTE — ED Provider Notes (Signed)
?[facility] ? ? ? ?CSN: [id number] ?Arrival date & time: 11/18/21  1858 ? ? ?  ? ?History   ?Chief Complaint ?Chief Complaint  ?Patient presents with  ? Urinary Frequency  ? Dysuria  ? ? ?HPI ?Lauren Torres is a 21 y.o. female who developed urinary frequency 2 days ago, and since yesterday dysuria. Denies abnormal vaginal discharge. Her periods have been irregular since Jan this year.  ? ? ? ?History reviewed. No pertinent past medical history. ? ?There are no problems to display for this patient. ? ? ?Past Surgical History:  ?Procedure Laterality Date  ? WISDOM TOOTH EXTRACTION    ? ? ?OB History   ?No obstetric history on file. ?  ? ? ? ?Home Medications   ? ?Prior to Admission medications   ?Medication Sig Start Date End Date Taking? Authorizing Provider  ?nitrofurantoin, macrocrystal-monohydrate, (MACROBID) 100 MG capsule Take 1 capsule (100 mg total) by mouth 2 (two) times daily. 11/18/21  Yes Rodriguez-Southworth, Nettie Elm, PA-C  ?phenazopyridine (PYRIDIUM) 200 MG tablet Take 1 tablet (200 mg total) by mouth 3 (three) times daily. 11/18/21  Yes Rodriguez-Southworth, Nettie Elm, PA-C  ?dicyclomine (BENTYL) 10 MG capsule Take 1 capsule (10 mg total) by mouth 4 (four) times daily. 03/10/17 03/04/20  Rebecka Apley, MD  ? ? ?Family History ?Family History  ?Problem Relation Age of Onset  ? Healthy Mother   ? Healthy Father   ? ? ?Social History ?Social History  ? ?Tobacco Use  ? Smoking status: Never  ? Smokeless tobacco: Never  ?Vaping Use  ? Vaping Use: Never used  ?Substance Use Topics  ? Alcohol use: No  ? Drug use: No  ? ? ? ?Allergies   ?Patient has no known allergies. ? ? ?Review of Systems ?Review of Systems  ?Constitutional:  Negative for chills and fever.  ?Genitourinary:  Positive for dysuria, frequency and menstrual problem. Negative for flank pain, urgency and vaginal discharge.  ? ? ?Physical Exam ?Triage Vital Signs ?ED Triage Vitals  ?Enc Vitals Group  ?   BP 11/18/21 1915 115/74  ?    Pulse Rate 11/18/21 1915 89  ?   Resp 11/18/21 1915 18  ?   Temp 11/18/21 1915 98.3 ?F (36.8 ?C)  ?   Temp Source 11/18/21 1915 Oral  ?   SpO2 11/18/21 1915 99 %  ?   Weight 11/18/21 1912 128 lb 1.4 oz (58.1 kg)  ?   Height 11/18/21 1912 5\' 3"  (1.6 m)  ?   Head Circumference --   ?   Peak Flow --   ?   Pain Score 11/18/21 1912 5  ?   Pain Loc --   ?   Pain Edu? --   ?   Excl. in GC? --   ? ?No data found. ? ?Updated Vital Signs ?BP 115/74 (BP Location: Right Arm)   Pulse 89   Temp 98.3 ?F (36.8 ?C) (Oral)   Resp 18   Ht 5\' 3"  (1.6 m)   Wt 128 lb 1.4 oz (58.1 kg)   LMP 10/08/2021 (Approximate)   SpO2 99%   BMI 22.69 kg/m?  ? ?Visual Acuity ?Right Eye Distance:   ?Left Eye Distance:   ?Bilateral Distance:   ? ?Right Eye Near:   ?Left Eye Near:    ?Bilateral Near:    ? ?Physical Exam ?Physical Exam ?Vitals and nursing note reviewed.  ?Constitutional:   ?   General: She is not in acute distress. ?  Appearance: She is not toxic-appearing.  ?HENT:  ?   Head: Normocephalic.  ?   Right Ear: External ear normal.  ?   Left Ear: External ear normal.  ?Eyes:  ?   General: No scleral icterus. ?   Conjunctiva/sclera: Conjunctivae normal.  ?Pulmonary:  ?   Effort: Pulmonary effort is normal.  ?Abdominal:  ?   General: Bowel sounds are normal.  ?   Palpations: Abdomen is soft. There is no mass.  ?   Tenderness: There is no guarding or rebound.  ?   Comments: - CVA tenderness   ?Musculoskeletal:     ?   General: Normal range of motion.  ?   Cervical back: Neck supple.  ? ?Skin: ?   General: Skin is warm and dry.  ?   Findings: No rash.  ?Neurological:  ?   Mental Status: She is alert and oriented to person, place, and time.  ?   Gait: Gait normal.  ?Psychiatric:     ?   Mood and Affect: Mood normal.     ?   Behavior: Behavior normal.     ?   Thought Content: Thought content normal.     ?   Judgment: Judgment normal.  ? ? ?UC Treatments / Results  ?Labs ?(all labs ordered are listed, but only abnormal results are  displayed) ?Labs Reviewed  ?URINALYSIS, ROUTINE W REFLEX MICROSCOPIC - Abnormal; Notable for the following components:  ?    Result Value  ? Hgb urine dipstick SMALL (*)   ? Leukocytes,Ua TRACE (*)   ? All other components within normal limits  ?URINALYSIS, MICROSCOPIC (REFLEX) - Abnormal; Notable for the following components:  ? Bacteria, UA RARE (*)   ? All other components within normal limits  ?URINE CULTURE  ?PREGNANCY, URINE  ? ? ?EKG ? ? ?Radiology ?No results found. ? ?Procedures ?Procedures (including critical care time) ? ?Medications Ordered in UC ?Medications - No data to display ? ?Initial Impression / Assessment and Plan / UC Course  ?I have reviewed the triage vital signs and the nursing notes. ? ?Pertinent labs  results that were available during my care of the patient were reviewed by me and considered in my medical decision making (see chart for details). ?Urine sent out for a culture ? ?Seems to have early UTI ? ?I placed her on Macrobid and pyridium as noted.  ? ?Final Clinical Impressions(s) / UC Diagnoses  ? ?Final diagnoses:  ?Acute cystitis without hematuria  ? ? ? ?Discharge Instructions   ? ?  ?We are going to sent the urine for a culture to confirm the infection, and if we need to change the medication we will call you.  ? ? ? ?ED Prescriptions   ? ? Medication Sig Dispense Auth. Provider  ? nitrofurantoin, macrocrystal-monohydrate, (MACROBID) 100 MG capsule Take 1 capsule (100 mg total) by mouth 2 (two) times daily. 10 capsule Rodriguez-Southworth, Nettie Elm, PA-C  ? phenazopyridine (PYRIDIUM) 200 MG tablet Take 1 tablet (200 mg total) by mouth 3 (three) times daily. 6 tablet Rodriguez-Southworth, Nettie Elm, PA-C  ? ?  ? ?PDMP not reviewed this encounter. ?  ?Garey Ham, PA-C ?11/18/21 1943 ? ?

## 2021-11-18 NOTE — Discharge Instructions (Signed)
We are going to sent the urine for a culture to confirm the infection, and if we need to change the medication we will call you.  ?

## 2021-11-21 LAB — URINE CULTURE: Culture: 50000 — AB

## 2022-03-09 ENCOUNTER — Ambulatory Visit
Admission: RE | Admit: 2022-03-09 | Discharge: 2022-03-09 | Disposition: A | Discharge status: Home / Self Care | Payer: Medicaid Other | Source: Ambulatory Visit | Attending: Emergency Medicine | Admitting: Emergency Medicine

## 2022-03-09 VITALS — BP 116/69 | HR 64 | Temp 98.4°F | Resp 18

## 2022-03-09 DIAGNOSIS — K219 Gastro-esophageal reflux disease without esophagitis: Secondary | ICD-10-CM | POA: Diagnosis not present

## 2022-03-09 MED ORDER — LIDOCAINE VISCOUS HCL 2 % MT SOLN
15.0000 mL | Freq: Once | OROMUCOSAL | Status: AC
  Administered 2022-03-09: 15 mL via ORAL

## 2022-03-09 MED ORDER — FAMOTIDINE 20 MG PO TABS: 20.0000 mg | ORAL_TABLET | Freq: Two times a day (BID) | ORAL | 0 refills | Status: DC

## 2022-03-09 MED ORDER — ALUM & MAG HYDROXIDE-SIMETH 200-200-20 MG/5ML PO SUSP
30.0000 mL | Freq: Once | ORAL | Status: AC
  Administered 2022-03-09: 30 mL via ORAL

## 2022-03-09 MED ORDER — ALUMINUM-MAGNESIUM-SIMETHICONE 200-200-20 MG/5ML PO SUSP: 15.0000 mL | Freq: Three times a day (TID) | ORAL | 0 refills | Status: DC

## 2022-03-09 NOTE — ED Provider Notes (Signed)
[facility]    CSN: [id number] Arrival date & time: 03/09/22  1233      History   Chief Complaint Chief Complaint  Patient presents with   Abdominal Pain    Extreme abdominal pain that is caused after I eat, burning sensation all over my stomach, lower abdomen pain. - Entered by patient    HPI Tesla Arrow Emmerich is a 21 y.o. female.   Presents with left centralized and rapid upper quadrant abdominal pain and nausea occurring intermittently for 3 days.  Pain is described as an intense burning sensation.  This began shortly after eating a salad.  Has attempted use of Tylenol which was ineffective.  Denies any dietary changes, alcohol usage.  Denies abdominal bloating, increased gas production, vomiting, diarrhea .   History reviewed. No pertinent past medical history.  There are no problems to display for this patient.   Past Surgical History:  Procedure Laterality Date   WISDOM TOOTH EXTRACTION      OB History   No obstetric history on file.      Home Medications    Prior to Admission medications   Medication Sig Start Date End Date Taking? Authorizing Provider  nitrofurantoin, macrocrystal-monohydrate, (MACROBID) 100 MG capsule Take 1 capsule (100 mg total) by mouth 2 (two) times daily. 11/18/21   Rodriguez-Southworth, Nettie Elm, PA-C  phenazopyridine (PYRIDIUM) 200 MG tablet Take 1 tablet (200 mg total) by mouth 3 (three) times daily. 11/18/21   Rodriguez-Southworth, Nettie Elm, PA-C  dicyclomine (BENTYL) 10 MG capsule Take 1 capsule (10 mg total) by mouth 4 (four) times daily. 03/10/17 03/04/20  Rebecka Apley, MD    Family History Family History  Problem Relation Age of Onset   Healthy Mother    Healthy Father     Social History Social History   Tobacco Use   Smoking status: Never   Smokeless tobacco: Never  Vaping Use   Vaping Use: Never used  Substance Use Topics   Alcohol use: No   Drug use: No     Allergies   Patient has no known  allergies.   Review of Systems Review of Systems  Constitutional: Negative.   HENT: Negative.    Respiratory: Negative.    Cardiovascular: Negative.   Gastrointestinal:  Positive for abdominal pain and nausea. Negative for abdominal distention, anal bleeding, blood in stool, constipation, diarrhea, rectal pain and vomiting.  Neurological: Negative.      Physical Exam Triage Vital Signs ED Triage Vitals  Enc Vitals Group     BP 03/09/22 1258 116/69     Pulse Rate 03/09/22 1258 64     Resp 03/09/22 1258 18     Temp 03/09/22 1258 98.4 F (36.9 C)     Temp Source 03/09/22 1258 Oral     SpO2 03/09/22 1258 100 %     Weight --      Height --      Head Circumference --      Peak Flow --      Pain Score 03/09/22 1257 5     Pain Loc --      Pain Edu? --      Excl. in GC? --    No data found.  Updated Vital Signs BP 116/69 (BP Location: Right Arm)   Pulse 64   Temp 98.4 F (36.9 C) (Oral)   Resp 18   SpO2 100%   Visual Acuity Right Eye Distance:   Left Eye Distance:   Bilateral Distance:  Right Eye Near:   Left Eye Near:    Bilateral Near:     Physical Exam Constitutional:      Appearance: Normal appearance. She is well-developed.  Eyes:     Extraocular Movements: Extraocular movements intact.  Pulmonary:     Effort: Pulmonary effort is normal.  Abdominal:     General: Abdomen is flat. Bowel sounds are normal.     Palpations: Abdomen is soft.     Tenderness: There is abdominal tenderness in the epigastric area and periumbilical area.  Skin:    General: Skin is warm and dry.  Neurological:     General: No focal deficit present.     Mental Status: She is alert and oriented to person, place, and time.  Psychiatric:        Mood and Affect: Mood normal.        Behavior: Behavior normal.      UC Treatments / Results  Labs (all labs ordered are listed, but only abnormal results are displayed) Labs Reviewed - No data to display  EKG   Radiology No  results found.  Procedures Procedures (including critical care time)  Medications Ordered in UC Medications - No data to display  Initial Impression / Assessment and Plan / UC Course  I have reviewed the triage vital signs and the nursing notes.  Pertinent labs & imaging results that were available during my care of the patient were reviewed by me and considered in my medical decision making (see chart for details).  GERD without esophagitis  Vital signs are stable and patient is in no signs of distress, mild tenderness is noted in the gastric and periumbilical region, low suspicion for an acute abdomen at this time, given Maalox with lidocaine in office and on 15-minute reevaluation pain has begun to subside but patient is having slight dizziness from lidocaine, stable for discharge, prescribed famotidine twice daily for 14 days and Maalox without lidocaine to be used at home, recommended a bland diet and avoidance of triggering foods which have been listed on paper, recommended sitting up 30 minutes after eating and avoidance of exercise directly after eating, may follow-up with his urgent care as needed if symptoms, work note given Final Clinical Impressions(s) / UC Diagnoses   Final diagnoses:  None   Discharge Instructions   None    ED Prescriptions   None    PDMP not reviewed this encounter.   Valinda Hoar, [place] 03/09/22 1339

## 2022-03-09 NOTE — ED Triage Notes (Signed)
Pt reports a burning sensation in abdomen since Monday. States on Tuesday she started experiencing right upper abdominal pain that migrated to the middle and the burning sensation.  Has tried tylenol for pain.

## 2022-03-09 NOTE — Discharge Instructions (Signed)
Today you are being treated for acid reflux  Begin use of famotidine every morning and every evening for 14 days, this will reduce the amount of acid produced by the stomach   You may use Maalox as needed which is the medication that you have been given here in the office, ideally you can take 30 minutes prior to eating meals to prevent flareups of symptoms  While symptoms are present you may want to avoid the following  foods and drinks such as they may irritate your stomach: Coffee and tea, with or without caffeine. Drinks that contain alcohol. Energy drinks and sports drinks. Carbonated drinks or sodas. Chocolate and cocoa. Peppermint and mint flavorings. Garlic and onions. Horseradish. Spicy and acidic foods, including peppers, chili powder, curry powder, vinegar, hot sauces, and barbecue sauce. Citrus fruit juices and citrus fruits, such as oranges, lemons, and limes. Tomato-based foods, such as red sauce, chili, salsa, and pizza with red sauce. Fried and fatty foods, such as donuts, french fries, potato chips, and high-fat dressings. High-fat meats, such as hot dogs and fatty cuts of red and Tylynn Braniff meats, such as rib eye steak, sausage, ham, and bacon. High-fat dairy items, such as whole milk, butter, and cream cheese. Eat small, frequent meals instead of large meals. Avoid drinking large amounts of liquid with your meals. Avoid eating meals during the 2-3 hours before bedtime. Avoid lying down right after you eat. Do not exercise right after you eat.  If your symptoms continue to persist you may follow-up with urgent care for reevaluation

## 2022-06-12 ENCOUNTER — Encounter: Payer: Self-pay | Admitting: Emergency Medicine

## 2022-06-12 ENCOUNTER — Ambulatory Visit
Admission: EM | Admit: 2022-06-12 | Discharge: 2022-06-12 | Disposition: A | Discharge status: Home / Self Care | Payer: No Typology Code available for payment source | Attending: Physician Assistant | Admitting: Physician Assistant

## 2022-06-12 DIAGNOSIS — N760 Acute vaginitis: Secondary | ICD-10-CM | POA: Insufficient documentation

## 2022-06-12 DIAGNOSIS — N3001 Acute cystitis with hematuria: Secondary | ICD-10-CM | POA: Insufficient documentation

## 2022-06-12 LAB — URINALYSIS, ROUTINE W REFLEX MICROSCOPIC
Bilirubin Urine: NEGATIVE
Glucose, UA: NEGATIVE mg/dL
Nitrite: NEGATIVE
Protein, ur: NEGATIVE mg/dL
Specific Gravity, Urine: 1.025 (ref 1.005–1.030)
pH: 5.5 (ref 5.0–8.0)

## 2022-06-12 LAB — WET PREP, GENITAL
Sperm: NONE SEEN
Trich, Wet Prep: NONE SEEN
WBC, Wet Prep HPF POC: <10 (ref <10)

## 2022-06-12 LAB — URINALYSIS, MICROSCOPIC (REFLEX)

## 2022-06-12 LAB — PREGNANCY, URINE: Preg Test, Ur: NEGATIVE

## 2022-06-12 MED ORDER — FLUCONAZOLE 150 MG PO TABS: ORAL_TABLET | ORAL | 0 refills | Status: DC

## 2022-06-12 MED ORDER — PHENAZOPYRIDINE HCL 200 MG PO TABS: 200.0000 mg | ORAL_TABLET | Freq: Three times a day (TID) | ORAL | 0 refills | Status: DC

## 2022-06-12 MED ORDER — NITROFURANTOIN MONOHYD MACRO 100 MG PO CAPS: 100.0000 mg | ORAL_CAPSULE | Freq: Two times a day (BID) | ORAL | 0 refills | Status: DC

## 2022-06-12 MED ORDER — METRONIDAZOLE 500 MG PO TABS: 500.0000 mg | ORAL_TABLET | Freq: Two times a day (BID) | ORAL | 0 refills | Status: AC

## 2022-06-12 NOTE — ED Provider Notes (Signed)
[facility]    CSN: [id number] Arrival date & time: 06/12/22  1420      History   Chief Complaint Chief Complaint  Patient presents with   Dysuria    HPI Lauren Torres is a 21 y.o. female presenting for approximately 1 week history of dysuria and bladder pressure.  She denies urinary frequency or urgency.  Has not really had any abdominal pain or flank pain.  Denies hematuria.  Reports no vaginal discharge or itching.  No concern for STIs.  Patient says she has a history of heavy menstrual periods and recently had vaginal bleeding for about 20 days before she came off her.  And that is when her current symptoms began.  She believes she has a urinary tract infection.  She says someone once told her in urgent care she probably had endometriosis.  She has never had a Pap smear.  She has no other complaints.  HPI  History reviewed. No pertinent past medical history.  There are no problems to display for this patient.   Past Surgical History:  Procedure Laterality Date   WISDOM TOOTH EXTRACTION      OB History   No obstetric history on file.      Home Medications    Prior to Admission medications   Medication Sig Start Date End Date Taking? Authorizing Provider  fluconazole (DIFLUCAN) 150 MG tablet Take 1 tab p.o. every 72 hours for yeast infection 06/12/22  Yes Laurene Footman B, PA-C  metroNIDAZOLE (FLAGYL) 500 MG tablet Take 1 tablet (500 mg total) by mouth 2 (two) times daily for 7 days. 06/12/22 06/19/22 Yes Danton Clap, PA-C  nitrofurantoin, macrocrystal-monohydrate, (MACROBID) 100 MG capsule Take 1 capsule (100 mg total) by mouth 2 (two) times daily. 06/12/22  Yes Danton Clap, PA-C  phenazopyridine (PYRIDIUM) 200 MG tablet Take 1 tablet (200 mg total) by mouth 3 (three) times daily. 06/12/22  Yes Laurene Footman B, PA-C  aluminum-magnesium hydroxide-simethicone (MAALOX) 200-200-20 MG/5ML SUSP Take 15 mLs by mouth 4 (four) times daily -  before  meals and at bedtime. 03/09/22   White, Leitha Schuller, NP  famotidine (PEPCID) 20 MG tablet Take 1 tablet (20 mg total) by mouth 2 (two) times daily. 03/09/22   White, Leitha Schuller, NP  dicyclomine (BENTYL) 10 MG capsule Take 1 capsule (10 mg total) by mouth 4 (four) times daily. 03/10/17 03/04/20  Loney Hering, MD    Family History Family History  Problem Relation Age of Onset   Healthy Mother    Healthy Father     Social History Social History   Tobacco Use   Smoking status: Never   Smokeless tobacco: Never  Vaping Use   Vaping Use: Never used  Substance Use Topics   Alcohol use: No   Drug use: No     Allergies   Patient has no known allergies.   Review of Systems Review of Systems  Constitutional:  Negative for chills, fatigue and fever.  Gastrointestinal:  Negative for abdominal pain, diarrhea, nausea and vomiting.  Genitourinary:  Positive for dysuria. Negative for decreased urine volume, flank pain, frequency, hematuria, pelvic pain, urgency, vaginal bleeding, vaginal discharge and vaginal pain.  Musculoskeletal:  Negative for back pain.  Skin:  Negative for rash.     Physical Exam Triage Vital Signs ED Triage Vitals [06/12/22 1433]  Enc Vitals Group     BP      Pulse      Resp  Temp      Temp src      SpO2      Weight 138 lb (62.6 kg)     Height 5\' 3"  (1.6 m)     Head Circumference      Peak Flow      Pain Score 8     Pain Loc      Pain Edu?      Excl. in GC?    No data found.  Updated Vital Signs BP 113/70 (BP Location: Left Arm)   Pulse 82   Temp 98.2 F (36.8 C) (Oral)   Resp 14   Ht 5\' 3"  (1.6 m)   Wt 138 lb (62.6 kg)   LMP 05/19/2022 (Approximate)   SpO2 98%   BMI 24.45 kg/m      Physical Exam Vitals and nursing note reviewed.  Constitutional:      General: She is not in acute distress.    Appearance: Normal appearance. She is not ill-appearing or toxic-appearing.  HENT:     Head: Normocephalic and atraumatic.  Eyes:      General: No scleral icterus.       Right eye: No discharge.        Left eye: No discharge.     Conjunctiva/sclera: Conjunctivae normal.  Cardiovascular:     Rate and Rhythm: Normal rate and regular rhythm.     Heart sounds: Normal heart sounds.  Pulmonary:     Effort: Pulmonary effort is normal. No respiratory distress.     Breath sounds: Normal breath sounds.  Abdominal:     Palpations: Abdomen is soft.     Tenderness: There is abdominal tenderness (suprapubic). There is no right CVA tenderness or left CVA tenderness.  Musculoskeletal:     Cervical back: Neck supple.  Skin:    General: Skin is dry.  Neurological:     General: No focal deficit present.     Mental Status: She is alert. Mental status is at baseline.     Motor: No weakness.     Gait: Gait normal.  Psychiatric:        Mood and Affect: Mood normal.        Behavior: Behavior normal.        Thought Content: Thought content normal.      UC Treatments / Results  Labs (all labs ordered are listed, but only abnormal results are displayed) Labs Reviewed  WET PREP, GENITAL - Abnormal; Notable for the following components:      Result Value   Yeast Wet Prep HPF POC PRESENT (*)    Clue Cells Wet Prep HPF POC PRESENT (*)    All other components within normal limits  URINALYSIS, ROUTINE W REFLEX MICROSCOPIC - Abnormal; Notable for the following components:   APPearance HAZY (*)    Hgb urine dipstick MODERATE (*)    Ketones, ur TRACE (*)    Leukocytes,Ua TRACE (*)    All other components within normal limits  URINALYSIS, MICROSCOPIC (REFLEX) - Abnormal; Notable for the following components:   Bacteria, UA MANY (*)    All other components within normal limits  URINE CULTURE  PREGNANCY, URINE    EKG   Radiology No results found.  Procedures Procedures (including critical care time)  Medications Ordered in UC Medications - No data to display  Initial Impression / Assessment and Plan / UC Course  I have  reviewed the triage vital signs and the nursing notes.  Pertinent labs & imaging results  that were available during my care of the patient were reviewed by me and considered in my medical decision making (see chart for details).   21 year old female presenting for dysuria for the past 1 week.  Vitals normal and stable.  Abdomen is soft with some suprapubic tenderness.  No CVA tenderness.  Urine pregnancy negative.  Urinalysis shows hazy urine with moderate hemoglobin, ketones and trace leukocytes with many bacteria on microscopic analysis.  Sent urine for culture.  Wet prep shows yeast and clue cells.  Discussed all results with patient.  Advised her that her results are consistent with bacterial vaginosis, yeast vaginitis and a UTI.  Sent metronidazole, Diflucan and Macrobid as well as Pyridium.  Encouraged increasing rest and fluid intake.  Advised her we will alter her antibiotic based on the culture if needed.  Advised her to make an appointment with OB/GYN if she does need a Pap smear and further work-up for the heavy menstrual periods.   Final Clinical Impressions(s) / UC Diagnoses   Final diagnoses:  Acute cystitis with hematuria  Acute vaginitis     Discharge Instructions      -You have bacterial vaginosis and yeast infection.  I have sent metronidazole to the pharmacy to treat BV infection.  Do not drink alcohol with this medication.  Make sure to take plenty of fluids with it. - Start the fluconazole medication in a couple of days since you are not having any vaginal itching or discharge.  Take a second tablet 3 days after that if you do develop vaginal itching or discharge. - You also have a UTI.  I have sent antibiotics for that.  See more information below. - You should set up an appointment with OB/GYN for a pelvic exam and Pap smear since you have never had 1.  You can also discuss your heavy menstrual periods.  UTI: Based on either symptoms or urinalysis, you may have a  urinary tract infection. We will send the urine for culture and call with results in a few days. Begin antibiotics at this time. Your symptoms should be much improved over the next 2-3 days. Increase rest and fluid intake. If for some reason symptoms are worsening or not improving after a couple of days or the urine culture determines the antibiotics you are taking will not treat the infection, the antibiotics may be changed. Return or go to ER for fever, back pain, worsening urinary pain, discharge, increased blood in urine. May take Tylenol or Motrin OTC for pain relief or consider AZO if no contraindications      ED Prescriptions     Medication Sig Dispense Auth. Provider   nitrofurantoin, macrocrystal-monohydrate, (MACROBID) 100 MG capsule Take 1 capsule (100 mg total) by mouth 2 (two) times daily. 10 capsule Eusebio Friendly B, PA-C   phenazopyridine (PYRIDIUM) 200 MG tablet Take 1 tablet (200 mg total) by mouth 3 (three) times daily. 6 tablet Eusebio Friendly B, PA-C   metroNIDAZOLE (FLAGYL) 500 MG tablet Take 1 tablet (500 mg total) by mouth 2 (two) times daily for 7 days. 14 tablet Eusebio Friendly B, PA-C   fluconazole (DIFLUCAN) 150 MG tablet Take 1 tab p.o. every 72 hours for yeast infection 2 tablet Shirlee Latch, PA-C      PDMP not reviewed this encounter.   Shirlee Latch, PA-C 06/12/22 1516

## 2022-06-12 NOTE — ED Triage Notes (Signed)
Patient c/o burning when urinating that started a week ago.  Patient reports frequent vaginal bleeding.  Patient denies vaginal discharge.

## 2022-06-12 NOTE — Discharge Instructions (Addendum)
-  You have bacterial vaginosis and yeast infection.  I have sent metronidazole to the pharmacy to treat BV infection.  Do not drink alcohol with this medication.  Make sure to take plenty of fluids with it. - Start the fluconazole medication in a couple of days since you are not having any vaginal itching or discharge.  Take a second tablet 3 days after that if you do develop vaginal itching or discharge. - You also have a UTI.  I have sent antibiotics for that.  See more information below. - You should set up an appointment with OB/GYN for a pelvic exam and Pap smear since you have never had 1.  You can also discuss your heavy menstrual periods.  UTI: Based on either symptoms or urinalysis, you may have a urinary tract infection. We will send the urine for culture and call with results in a few days. Begin antibiotics at this time. Your symptoms should be much improved over the next 2-3 days. Increase rest and fluid intake. If for some reason symptoms are worsening or not improving after a couple of days or the urine culture determines the antibiotics you are taking will not treat the infection, the antibiotics may be changed. Return or go to ER for fever, back pain, worsening urinary pain, discharge, increased blood in urine. May take Tylenol or Motrin OTC for pain relief or consider AZO if no contraindications

## 2022-06-13 LAB — URINE CULTURE: Culture: NO GROWTH

## 2022-06-20 ENCOUNTER — Telehealth: Payer: No Typology Code available for payment source | Admitting: Physician Assistant

## 2022-06-20 DIAGNOSIS — R3989 Other symptoms and signs involving the genitourinary system: Secondary | ICD-10-CM | POA: Diagnosis not present

## 2022-06-20 MED ORDER — CEPHALEXIN 500 MG PO CAPS: 500.0000 mg | ORAL_CAPSULE | Freq: Two times a day (BID) | ORAL | 0 refills | Status: AC

## 2022-06-20 NOTE — Progress Notes (Signed)
Virtual Visit Consent   [name], you are scheduled for a virtual visit with a [facility] provider today. Just as with appointments in the office, your consent must be obtained to participate. Your consent will be active for this visit and any virtual visit you may have with one of our providers in the next 365 days. If you have a MyChart account, a copy of this consent can be sent to you electronically.  As this is a virtual visit, video technology does not allow for your provider to perform a traditional examination. This may limit your provider's ability to fully assess your condition. If your provider identifies any concerns that need to be evaluated in person or the need to arrange testing (such as labs, EKG, etc.), we will make arrangements to do so. Although advances in technology are sophisticated, we cannot ensure that it will always work on either your end or our end. If the connection with a video visit is poor, the visit may have to be switched to a telephone visit. With either a video or telephone visit, we are not always able to ensure that we have a secure connection.  By engaging in this virtual visit, you consent to the provision of healthcare and authorize for your insurance to be billed (if applicable) for the services provided during this visit. Depending on your insurance coverage, you may receive a charge related to this service.  I need to obtain your verbal consent now. Are you willing to proceed with your visit today? [facility] has provided verbal consent on 06/20/2022 for a virtual visit (video or telephone). Lauren Torres, [place]  Date: 06/20/2022 12:52 PM  Virtual Visit via Video Note   I, Lauren Torres, connected with  Lauren Torres  ([id number], October 29, 2000) on 06/20/22 at 12:45 PM EDT by a video-enabled telemedicine application and verified that I am speaking with the correct person using two identifiers.  Location: Patient: Virtual  Visit Location Patient: Home Provider: Virtual Visit Location Provider: Home Office   I discussed the limitations of evaluation and management by telemedicine and the availability of in person appointments. The patient expressed understanding and agreed to proceed.    History of Present Illness: Lauren Torres is a 21 y.o. who identifies as a female who was assigned female at birth, and is being seen today for possible UTI. Notes being evaluated for dysuria a week or so ago at [facility] (notes and labs available in EMR). UA was concerning for UTI so culture sent. Her wet prep came back positive for BV and yeast so she was started on Diflucan and Flagyl which she has completed. Notes no recurrence of any vaginal symptoms -- pain, discharge, odor, etc. Still having dysuria, urgency, frequency, hesitancy and low back pain. Denies fever, chills. Was initially given script for Macrobid but urine culture negative, so only took couple days of medicine.    HPI: HPI  Problems: There are no problems to display for this patient.   Allergies: No Known Allergies Medications:  Current Outpatient Medications:    aluminum-magnesium hydroxide-simethicone (MAALOX) 409-811-91 MG/5ML SUSP, Take 15 mLs by mouth 4 (four) times daily -  before meals and at bedtime., Disp: 1680 mL, Rfl: 0   famotidine (PEPCID) 20 MG tablet, Take 1 tablet (20 mg total) by mouth 2 (two) times daily., Disp: 30 tablet, Rfl: 0   phenazopyridine (PYRIDIUM) 200 MG tablet, Take 1 tablet (200 mg total) by mouth 3 (three) times daily., Disp:  6 tablet, Rfl: 0  Bv and yeast.  Urinary urgency, frequency, dysuria.   Observations/Objective: Patient is well-developed, well-nourished in no acute distress.  Resting comfortably at home.  Head is normocephalic, atraumatic.  No labored breathing. Speech is clear and coherent with logical content.  Patient is alert and oriented at baseline.   Assessment and Plan: 1. Suspected UTI  Classic UTI  symptoms with absence of alarm signs or symptoms. Prior history of UTI. Concern for inaccurate culture. Will treat empirically with Keflex as better coverage for suspected uncomplicated cystitis and less likely to cause yeast. Supportive measures and OTC medications reviewed. Strict in-person evaluation precautions discussed -- if no improvement within 24-48 hours or anything worsening she is to be seen at closest UC (no PCP currently). ER precautions reviewed.   Follow Up Instructions: I discussed the assessment and treatment plan with the patient. The patient was provided an opportunity to ask questions and all were answered. The patient agreed with the plan and demonstrated an understanding of the instructions.  A copy of instructions were sent to the patient via MyChart unless otherwise noted below.   The patient was advised to call back or seek an in-person evaluation if the symptoms worsen or if the condition fails to improve as anticipated.  Time:  I spent 10 minutes with the patient via telehealth technology discussing the above problems/concerns.    Piedad Climes, PA-C

## 2022-06-20 NOTE — Patient Instructions (Signed)
[employer], thank you for joining Leeanne Rio, PA-C for today's virtual visit.  While this provider is not your primary care provider (PCP), if your PCP is located in our provider database this encounter information will be shared with them immediately following your visit.   [facility] account gives you access to today's visit and all your visits, tests, and labs performed at [facility] " click here if you don't have a [facility] account or go to [email or web address]  Consent: (Patient) Lauren Torres provided verbal consent for this virtual visit at the beginning of the encounter.  Current Medications:  Current Outpatient Medications:    aluminum-magnesium hydroxide-simethicone (MAALOX) 200-200-20 MG/5ML SUSP, Take 15 mLs by mouth 4 (four) times daily -  before meals and at bedtime., Disp: 1680 mL, Rfl: 0   famotidine (PEPCID) 20 MG tablet, Take 1 tablet (20 mg total) by mouth 2 (two) times daily., Disp: 30 tablet, Rfl: 0   fluconazole (DIFLUCAN) 150 MG tablet, Take 1 tab p.o. every 72 hours for yeast infection, Disp: 2 tablet, Rfl: 0   nitrofurantoin, macrocrystal-monohydrate, (MACROBID) 100 MG capsule, Take 1 capsule (100 mg total) by mouth 2 (two) times daily., Disp: 10 capsule, Rfl: 0   phenazopyridine (PYRIDIUM) 200 MG tablet, Take 1 tablet (200 mg total) by mouth 3 (three) times daily., Disp: 6 tablet, Rfl: 0   Medications ordered in this encounter:  No orders of the defined types were placed in this encounter.    *If you need refills on other medications prior to your next appointment, please contact your pharmacy*  Follow-Up: Call back or seek an in-person evaluation if the symptoms worsen or if the condition fails to improve as anticipated.  [facility] [phone]  Other Instructions Your symptoms are consistent with a bladder infection, also called acute cystitis. Please take your antibiotic  (Keflex) as directed until all pills are gone.  Stay very well hydrated.  Consider a daily probiotic (Align, Culturelle, or Activia) to help prevent stomach upset caused by the antibiotic.  Taking a probiotic daily may also help prevent recurrent UTIs.  Also consider taking AZO (Phenazopyridine) tablets to help decrease pain with urination.   Urinary Tract Infection A urinary tract infection (UTI) can occur any place along the urinary tract. The tract includes the kidneys, ureters, bladder, and urethra. A type of germ called bacteria often causes a UTI. UTIs are often helped with antibiotic medicine.  HOME CARE  If given, take antibiotics as told by your doctor. Finish them even if you start to feel better. Drink enough fluids to keep your pee (urine) clear or pale yellow. Avoid tea, drinks with caffeine, and bubbly (carbonated) drinks. Pee often. Avoid holding your pee in for a long time. Pee before and after having sex (intercourse). Wipe from front to back after you poop (bowel movement) if you are a woman. Use each tissue only once. GET HELP RIGHT AWAY IF:  You have back pain. You have lower belly (abdominal) pain. You have chills. You feel sick to your stomach (nauseous). You throw up (vomit). Your burning or discomfort with peeing does not go away. You have a fever. Your symptoms are not better in 3 days. MAKE SURE YOU:  Understand these instructions. Will watch your condition. Will get help right away if you are not doing well or get worse. Document Released: 02/01/2008 Document Revised: 05/09/2012 Document Reviewed: 03/15/2012 [facility] Patient Information 2015 [place], [place]. This information is  not intended to replace advice given to you by your health care provider. Make sure you discuss any questions you have with your health care provider.    If you have been instructed to have an in-person evaluation today at a local Urgent Care facility, please use the link below. It will  take you to a list of all of our available [facility], including address, phone number and hours of operation. Please do not delay care.  [facility]  If you or a family member do not have a primary care provider, use the link below to schedule a visit and establish care. When you choose a [facility] primary care physician or advanced practice provider, you gain a long-term partner in health. Find a Primary Care Provider  Learn more about [facility]'s in-office and virtual care options:  - Get Care Now

## 2022-07-04 DIAGNOSIS — R059 Cough, unspecified: Secondary | ICD-10-CM | POA: Diagnosis not present

## 2022-07-04 DIAGNOSIS — Z20822 Contact with and (suspected) exposure to covid-19: Secondary | ICD-10-CM | POA: Diagnosis not present

## 2022-07-04 DIAGNOSIS — J069 Acute upper respiratory infection, unspecified: Secondary | ICD-10-CM | POA: Diagnosis not present

## 2022-07-06 ENCOUNTER — Telehealth: Payer: Medicaid Other | Admitting: Physician Assistant

## 2022-07-06 DIAGNOSIS — R3989 Other symptoms and signs involving the genitourinary system: Secondary | ICD-10-CM | POA: Diagnosis not present

## 2022-07-06 MED ORDER — SULFAMETHOXAZOLE-TRIMETHOPRIM 800-160 MG PO TABS: 1.0000 | ORAL_TABLET | Freq: Two times a day (BID) | ORAL | 0 refills | Status: DC

## 2022-07-06 MED ORDER — PHENAZOPYRIDINE HCL 200 MG PO TABS: 200.0000 mg | ORAL_TABLET | Freq: Three times a day (TID) | ORAL | 0 refills | Status: DC

## 2022-07-06 NOTE — Progress Notes (Signed)
Virtual Visit Consent   Lauren Torres, you are scheduled for a virtual visit with a [facility] provider today. Just as with appointments in the office, your consent must be obtained to participate. Your consent will be active for this visit and any virtual visit you may have with one of our providers in the next 365 days. If you have a MyChart account, a copy of this consent can be sent to you electronically.  As this is a virtual visit, video technology does not allow for your provider to perform a traditional examination. This may limit your provider's ability to fully assess your condition. If your provider identifies any concerns that need to be evaluated in person or the need to arrange testing (such as labs, EKG, etc.), we will make arrangements to do so. Although advances in technology are sophisticated, we cannot ensure that it will always work on either your end or our end. If the connection with a video visit is poor, the visit may have to be switched to a telephone visit. With either a video or telephone visit, we are not always able to ensure that we have a secure connection.  By engaging in this virtual visit, you consent to the provision of healthcare and authorize for your insurance to be billed (if applicable) for the services provided during this visit. Depending on your insurance coverage, you may receive a charge related to this service.  I need to obtain your verbal consent now. Are you willing to proceed with your visit today? [name] has provided verbal consent on 07/06/2022 for a virtual visit (video or telephone). Margaretann Loveless, PA-C  Date: 07/06/2022 12:56 PM  Virtual Visit via Video Note   I, Margaretann Loveless, connected with  Lauren Torres  ([id number], 11-15-2000) on 07/06/22 at 12:45 PM EST by a video-enabled telemedicine application and verified that I am speaking with the correct person using two identifiers.  Location: Patient: Virtual  Visit Location Patient: Mobile Provider: Virtual Visit Location Provider: Home Office   I discussed the limitations of evaluation and management by telemedicine and the availability of in person appointments. The patient expressed understanding and agreed to proceed.    History of Present Illness: Lauren Torres is a 21 y.o. who identifies as a female who was assigned female at birth, and is being seen today for continued UTI symptoms. She was seen in person UC on 06/12/22 and had urine culture and wet prep swab obtained. Was started on Macrobid for possible UTI. Took 2 days but was notified to stop as urine culture was negative. Was started on Metronidazole and Fluconazole for BV and yeast from wet prep. Took all medications but did not have much symptom improvement so did a virtual appt on 06/20/22. Was treated with Keflex x 7 days for possible UTI. Took treatment and again did not have much symptom improvement. Had some improvement while on antibiotic but symptoms returned quickly after completing. Symptoms have been progressively worsening again and now having suprapubic pain and cramping, burning with urination and difficulty urinating.    Problems: There are no problems to display for this patient.   Allergies: No Known Allergies Medications:  Current Outpatient Medications:    aluminum-magnesium hydroxide-simethicone (MAALOX) 200-200-20 MG/5ML SUSP, Take 15 mLs by mouth 4 (four) times daily -  before meals and at bedtime., Disp: 1680 mL, Rfl: 0   famotidine (PEPCID) 20 MG tablet, Take 1 tablet (20 mg total) by mouth 2 (two) times  daily., Disp: 30 tablet, Rfl: 0   phenazopyridine (PYRIDIUM) 200 MG tablet, Take 1 tablet (200 mg total) by mouth 3 (three) times daily., Disp: 6 tablet, Rfl: 0   sulfamethoxazole-trimethoprim (BACTRIM DS) 800-160 MG tablet, Take 1 tablet by mouth 2 (two) times daily., Disp: 10 tablet, Rfl: 0  Observations/Objective: Patient is well-developed, well-nourished  in no acute distress.  Resting comfortably  Head is normocephalic, atraumatic.  No labored breathing.  Speech is clear and coherent with logical content.  Patient is alert and oriented at baseline.    Assessment and Plan: 1. Suspected UTI - sulfamethoxazole-trimethoprim (BACTRIM DS) 800-160 MG tablet; Take 1 tablet by mouth 2 (two) times daily.  Dispense: 10 tablet; Refill: 0 - phenazopyridine (PYRIDIUM) 200 MG tablet; Take 1 tablet (200 mg total) by mouth 3 (three) times daily.  Dispense: 6 tablet; Refill: 0  - Discussed possibility that this is not UTIs but could be something else, like interstitial cystitis - Advised we could try a broad spectrum antibiotic (Bactrim) and see if it may be a recurrent UTI, but if no improvement would recommend in person evaluation for a repeat urine culture to see if still negative and have considerations for referral to Urology for further evaluation - Pyridium given for pain and spasms - Push fluids - Discussed starting a urinary journal to see if any triggers present - May use tylenol and/or ibuprofen for pain - Seek in person evaluation if worsening or fails to improve  Follow Up Instructions: I discussed the assessment and treatment plan with the patient. The patient was provided an opportunity to ask questions and all were answered. The patient agreed with the plan and demonstrated an understanding of the instructions.  A copy of instructions were sent to the patient via MyChart unless otherwise noted below.    The patient was advised to call back or seek an in-person evaluation if the symptoms worsen or if the condition fails to improve as anticipated.  Time:  I spent 14 minutes with the patient via telehealth technology discussing the above problems/concerns.    Margaretann Loveless, PA-C

## 2022-07-06 NOTE — Patient Instructions (Signed)
[employer], thank you for joining Margaretann Loveless, PA-C for today's virtual visit.  While this provider is not your primary care provider (PCP), if your PCP is located in our provider database this encounter information will be shared with them immediately following your visit.   A [facility] MyChart account gives you access to today's visit and all your visits, tests, and labs performed at [facility] " click here if you don't have a [facility] MyChart account or go to [email or web address]  Consent: (Patient) Jadah Campos Sosa provided verbal consent for this virtual visit at the beginning of the encounter.  Current Medications:  Current Outpatient Medications:    sulfamethoxazole-trimethoprim (BACTRIM DS) 800-160 MG tablet, Take 1 tablet by mouth 2 (two) times daily., Disp: 10 tablet, Rfl: 0   aluminum-magnesium hydroxide-simethicone (MAALOX) 200-200-20 MG/5ML SUSP, Take 15 mLs by mouth 4 (four) times daily -  before meals and at bedtime., Disp: 1680 mL, Rfl: 0   famotidine (PEPCID) 20 MG tablet, Take 1 tablet (20 mg total) by mouth 2 (two) times daily., Disp: 30 tablet, Rfl: 0   phenazopyridine (PYRIDIUM) 200 MG tablet, Take 1 tablet (200 mg total) by mouth 3 (three) times daily., Disp: 6 tablet, Rfl: 0   Medications ordered in this encounter:  Meds ordered this encounter  Medications   sulfamethoxazole-trimethoprim (BACTRIM DS) 800-160 MG tablet    Sig: Take 1 tablet by mouth 2 (two) times daily.    Dispense:  10 tablet    Refill:  0    Order Specific Question:   Supervising Provider    Answer:   Merrilee Jansky [id number]   phenazopyridine (PYRIDIUM) 200 MG tablet    Sig: Take 1 tablet (200 mg total) by mouth 3 (three) times daily.    Dispense:  6 tablet    Refill:  0    Order Specific Question:   Supervising Provider    Answer:   Merrilee Jansky [id number]     *If you need refills on other medications prior to your next appointment, please  contact your pharmacy*  Follow-Up: Call back or seek an in-person evaluation if the symptoms worsen or if the condition fails to improve as anticipated.  [facility] [phone]  Other Instructions  Interstitial Cystitis  Interstitial cystitis is inflammation of the bladder. This condition is also known as painful bladder syndrome. This may cause pain in the bladder area as well as a frequent and urgent need to urinate. The bladder is an organ that stores urine after the urine is made in the kidneys. The severity of interstitial cystitis can vary from person to person. You may have flare-ups, and then your symptoms may go away for a while. For many people, it becomes a long-term (chronic) problem. What are the causes? The cause of this condition is not known. What increases the risk? The following factors may make you more likely to develop this condition: Being female. Having fibromyalgia. Having irritable bowel syndrome (IBS). Having endometriosis. Having chronic fatigue syndrome. This condition may be aggravated by: Stress. Smoking. Spicy foods. What are the signs or symptoms? Symptoms of interstitial cystitis vary, and they can change over time. Symptoms may include: Discomfort or pain in the bladder area, which is in the lower abdomen. Pain can range from mild to severe. The pain may change in intensity as the bladder fills with urine or as it empties. Pain in the pelvic area, between the hip bones. A constant urge to  urinate. Frequent urination. Pain during urination. Pain during sex. Blood in the urine. Feeling tired (fatigue). For women, symptoms often get worse during menstruation. How is this diagnosed? This condition is diagnosed based on your symptoms, your medical history, and a physical exam. Your health care provider may need to rule out other conditions and may order other tests, such as: Urine tests. Cystoscopy. For this test, a tool similar to  a very thin telescope is used to look into your bladder. Biopsy. This involves taking a sample of tissue from the bladder to be examined under a microscope. How is this treated? There is no cure for this condition, but treatment can help you control your symptoms. Work closely with your health care provider to find the most effective treatments for you. Treatment options may include: Medicines to relieve pain and reduce how often you feel the need to urinate. This treatment may include: A procedure where a small amount of medicine that eases irritation is put inside your bladder through a catheter (bladder instillation). Lifestyle changes, such as changing your diet or taking steps to control stress. Physical therapy. This may include: Exercises to help relax the pelvic floor muscles. Massage to relax tight muscles (myofascial release). Learning ways to control when you urinate (bladder training). Using a device that provides electrical stimulation to your nerves, which can relieve pain (neuromodulation therapy). The device is placed on your back, where it blocks the nerves that cause you to feel pain in your bladder area. A procedure that stretches your bladder by filling it with air or fluid (hydrodistention). Surgery. This is rare. It is only done for extreme cases, if other treatments do not help. Follow these instructions at home: Lifestyle Learn and practice relaxation techniques, such as deep breathing and muscle relaxation. Get care for your body and mental well-being, such as: Cognitive behavioral therapy (CBT). This therapy changes the way you think or act in response to different situations. This may improve how you feel. Seeing a mental health therapist to evaluate and treat depression, if necessary. Work with your health care provider on other ways to manage pain. Acupuncture may be helpful. Avoid drinking alcohol. Do not use any products that contain nicotine or tobacco. These  products include cigarettes, chewing tobacco, and vaping devices, such as e-cigarettes. If you need help quitting, ask your health care provider. Eating and drinking Make dietary changes as recommended by your health care provider. You may need to avoid: Spicy foods. Foods that contain a lot of potassium. Limit your intake of drinks that increase your urge to urinate. These include alcohol and caffeinated drinks like soda, coffee, and tea. Bladder training  Use bladder training techniques as directed. Techniques may include: Urinating at scheduled times. Training yourself to delay urination. Keep a bladder diary. Write down the times you urinate and any symptoms that you have. This can help you find out which foods, liquids, or activities make your symptoms worse. Use your bladder diary to schedule bathroom trips. If you are away from home, plan to be near a bathroom at each of your scheduled times. Make sure that you urinate just before you leave the house and just before you go to bed. General instructions Take over-the-counter and prescription medicines only as told by your health care provider. Try a warm or cool compress over your bladder for comfort. Avoid wearing tight clothing. Do exercises to relax your pelvic floor muscles as told by your physical therapist. Keep all follow-up visits. This is important.  Where to find more information To find more information or a support group near you, visit: U[employer] Foundation: urologyhealth.org Interstitial Cystitis Association: [employer] Contact a health care provider if you have: Symptoms that do not get better with treatment. Pain or discomfort that gets worse. More frequent urges to urinate. A fever. Get help right away if: You have no control over when you urinate. Summary Interstitial cystitis is inflammation of the bladder. This condition may cause pain in the bladder area as well as a frequent and urgent need to  urinate. You may have flare-ups of the condition, and then it may go away for a while. For many people, it becomes a long-term (chronic) problem. There is no cure for interstitial cystitis, but treatment methods are available to control your symptoms. This information is not intended to replace advice given to you by your health care provider. Make sure you discuss any questions you have with your health care provider. Document Revised: 03/20/2020 Document Reviewed: 03/20/2020 Elsevier Patient Education  2023 Elsevier Inc.   Eating Plan for Interstitial Cystitis Interstitial cystitis (IC) is a long-term (chronic) condition that causes pain and pressure in the bladder, the lower abdomen, and the pelvic area. Other symptoms of IC include urinary urgency and frequency. Symptoms tend to come and go. Many people with IC find that certain foods trigger their symptoms. Different foods may be problematic for different people. Some foods are more likely to cause symptoms than others. Learning which foods bother you and which do not can help you come up with an eating plan to manage IC. What are tips for following this plan? You may find it helpful to work with a dietitian. A dietician can help you develop an eating plan by doing an elimination diet. This diet involves: Creating a list of foods that you think trigger your IC symptoms combined with the foods that most commonly trigger symptoms for many people with IC. It may take several months to find out which foods bother you. Eliminating those foods from your diet for about one month, then reintroducing the foods one at a time to see which ones trigger your symptoms. Reading food labels Once you know which foods trigger your IC symptoms, you can avoid them. However, it is also a good idea to read food labels because some foods that trigger your symptoms may be included as ingredients in other foods. These ingredients may include: Soy. Worcestershire  sauce. Vinegar. Alcohol. Artificial sweeteners. Monosodium glutamate. Other potential triggers include: Chili peppers. Tomato products. Citrus fruits, flavors, or juices. Shopping Shopping can be a challenge if many foods trigger your IC. When you go grocery shopping, bring a list of the foods you cannot eat. You can get an app for your phone that lets you know which foods are the safest and which you may want to avoid. You can find the app at the Interstitial Cystitis Network website: www.ic-network.com Meal planning Plan your meals according to the results of your elimination diet. If you have not done an elimination diet, plan meals according to IC food lists recommended by your health care provider or dietitian. These lists tell you which foods are least and most likely to cause symptoms. Avoid certain types of food when you go out to eat, such as pizza and foods typically served at [place], [place], and [place]. These foods often contain ingredients that can aggravate IC. General information Here are some general guidelines for an IC eating plan: Do not eat large portions.  Drink plenty of fluids with your meals. Do not eat foods that are high in sugar, salt, or saturated fat. Choose whole fruits instead of juice. Eat a colorful variety of vegetables. What foods should I eat? For people with IC, the best diet is a balanced one that includes things from all the food groups. Even if you have to avoid certain foods, there are still plenty of healthy choices in each group. The following are some foods that are least bothersome and may be safest to eat: Fruits Bananas. Blueberries and blueberry juice. Melons. Pears. Apples. Dates. Prunes. Raisins. Apricots. Vegetables Asparagus. Avocado. Celery. Beets. Bell peppers. Black olives. Broccoli. Brussels sprouts. Cabbage. Carrots. Cauliflower. Cucumber. Eggplant. Green beans. Potatoes. Radishes. Spinach. Squash. Turnips. Zucchini.  Mushrooms. Peas. Grains Oats. Rice. Bran. Oatmeal. Whole wheat bread. Meats and other proteins Beef. Fish and other seafood. Eggs. Nuts. Peanut butter. Pork. Poultry. Lamb. Garbanzo beans. Pinto beans. Dairy Whole or low-fat milk. American, mozzarella, mild cheddar, feta, ricotta, and cream cheeses. The items listed above may not be a complete list of foods and beverages you can eat. Contact a dietitian for more information. What foods should I avoid? You should avoid any foods that seem to trigger your symptoms. It is also a good idea to avoid foods that are most likely to cause symptoms in many people with IC. These include the following: Fruits Citrus fruits, including lemons, limes, oranges, and grapefruit. Cranberries. Strawberries. Pineapple. Kiwi. Vegetables Chili peppers. Onions. Sauerkraut. Tomato and tomato products. Rosita Fire. Grains You do not need to avoid any type of grain unless it triggers your symptoms. Meats and other proteins Precooked or cured meats, such as sausages or meat loaves. Soy products. Dairy Chocolate ice cream. Processed cheese. Yogurt. Beverages Alcohol. Chocolate drinks. Coffee. Cranberry juice. Carbonated drinks. Tea (black, green, or herbal). Tomato juice. Sports drinks. The items listed above may not be a complete list of foods and beverages you should avoid. Contact a dietitian for more information. Summary Many people with IC find that certain foods trigger their symptoms. Different foods may be problematic for different people. You may find it helpful to work with a dietitian to do an elimination diet and come up with an eating plan that is right for you. Plan your meals according to the results of your elimination diet. If you have not done an elimination diet, plan your meals using IC food lists. These lists tell you which foods are least and most likely to cause symptoms. The best diet for people with IC is a balanced diet that includes foods from  all the food groups. Even if you have to avoid certain foods, there are still plenty of healthy choices in each group. This information is not intended to replace advice given to you by your health care provider. Make sure you discuss any questions you have with your health care provider. Document Revised: 09/19/2021 Document Reviewed: 09/19/2021 Elsevier Patient Education  2023 Elsevier Inc.    If you have been instructed to have an in-person evaluation today at a local Urgent Care facility, please use the link below. It will take you to a list of all of our available [facility], including address, phone number and hours of operation. Please do not delay care.  [facility]  If you or a family member do not have a primary care provider, use the link below to schedule a visit and establish care. When you choose a [facility] primary care physician or  advanced practice provider, you gain a long-term partner in health. Find a Primary Care Provider  Learn more about [facility]'s in-office and virtual care options: [facility]

## 2022-09-10 ENCOUNTER — Ambulatory Visit
Admission: EM | Admit: 2022-09-10 | Discharge: 2022-09-10 | Disposition: A | Discharge status: Home / Self Care | Payer: Medicaid Other | Attending: Emergency Medicine | Admitting: Emergency Medicine

## 2022-09-10 ENCOUNTER — Encounter: Payer: Self-pay | Admitting: Emergency Medicine

## 2022-09-10 DIAGNOSIS — J069 Acute upper respiratory infection, unspecified: Secondary | ICD-10-CM | POA: Diagnosis not present

## 2022-09-10 LAB — GROUP A STREP BY PCR: Group A Strep by PCR: NOT DETECTED

## 2022-09-10 MED ORDER — PROMETHAZINE-DM 6.25-15 MG/5ML PO SYRP: 5.0000 mL | ORAL_SOLUTION | Freq: Four times a day (QID) | ORAL | 0 refills | Status: DC | PRN

## 2022-09-10 MED ORDER — BENZONATATE 100 MG PO CAPS: 200.0000 mg | ORAL_CAPSULE | Freq: Three times a day (TID) | ORAL | 0 refills | Status: DC

## 2022-09-10 MED ORDER — IPRATROPIUM BROMIDE 0.06 % NA SOLN: 2.0000 | Freq: Four times a day (QID) | NASAL | 12 refills | Status: DC

## 2022-09-10 NOTE — Discharge Instructions (Addendum)
Your test for strep today was negative.  I do believe you have a viral respiratory infection that is contributing to your symptoms.  Use over-the-counter Tylenol and ibuprofen according to package instructions as needed for fever or pain.  Gargle with warm salt water, 1 tablespoon of table salt in 8 ounces of warm water, gargle and spit as needed for throat pain.  You can use over-the-counter Chloraseptic and Sucrets lozenges to help you with sore throat pain as well.  Do not use more than 1 lozenge every 2 hours as the menthol may give you diarrhea.  Use the Atrovent nasal spray, 2 squirts in each nostril every 6 hours, as needed for runny nose and postnasal drip.  Use the Tessalon Perles every 8 hours during the day.  Take them with a small sip of water.  They may give you some numbness to the base of your tongue or a metallic taste in your mouth, this is normal.  Use the Promethazine DM cough syrup at bedtime for cough and congestion.  It will make you drowsy so do not take it during the day.  Rest your voice is much as possible to help avoid any further loss of voice.  You may also consider sipping frequent warm or cool beverages, whichever makes your throat feel better, to help maintain lubrication and decrease inflammation in your throat.  Return for reevaluation or see your primary care provider for any new or worsening symptoms.

## 2022-09-10 NOTE — ED Provider Notes (Signed)
[facility]    CSN: [id number] Arrival date & time: 09/10/22  1318      History   Chief Complaint Chief Complaint  Patient presents with   Sore Throat    HPI Latera Lauren Torres is a 22 y.o. female.   HPI  22 year old female here for evaluation of sore throat.  The patient reports that she has been experiencing sore throat, nasal congestion, and pain in her right ear for the last 3 days.  She has had a nonproductive cough for the last week.  She does report that a lot of her coworkers are coughing so she did not think very much of it.  She did start to lose her voice yesterday.  She has not had a fever and she denies shortness of breath or wheezing.  History reviewed. No pertinent past medical history.  There are no problems to display for this patient.   Past Surgical History:  Procedure Laterality Date   WISDOM TOOTH EXTRACTION      OB History   No obstetric history on file.      Home Medications    Prior to Admission medications   Medication Sig Start Date End Date Taking? Authorizing Provider  benzonatate (TESSALON) 100 MG capsule Take 2 capsules (200 mg total) by mouth every 8 (eight) hours. 09/10/22  Yes Margarette Canada, NP  ipratropium (ATROVENT) 0.06 % nasal spray Place 2 sprays into both nostrils 4 (four) times daily. 09/10/22  Yes Margarette Canada, NP  promethazine-dextromethorphan (PROMETHAZINE-DM) 6.25-15 MG/5ML syrup Take 5 mLs by mouth 4 (four) times daily as needed. 09/10/22  Yes Margarette Canada, NP  dicyclomine (BENTYL) 10 MG capsule Take 1 capsule (10 mg total) by mouth 4 (four) times daily. 03/10/17 03/04/20  Loney Hering, MD    Family History Family History  Problem Relation Age of Onset   Healthy Mother    Healthy Father     Social History Social History   Tobacco Use   Smoking status: Never   Smokeless tobacco: Never  Vaping Use   Vaping Use: Never used  Substance Use Topics   Alcohol use: No   Drug use: No     Allergies    Patient has no known allergies.   Review of Systems Review of Systems  Constitutional:  Negative for fever.  HENT:  Positive for congestion, ear pain, rhinorrhea, sore throat and voice change.   Respiratory:  Positive for cough. Negative for shortness of breath and wheezing.      Physical Exam Triage Vital Signs ED Triage Vitals  Enc Vitals Group     BP      Pulse      Resp      Temp      Temp src      SpO2      Weight      Height      Head Circumference      Peak Flow      Pain Score      Pain Loc      Pain Edu?      Excl. in [place]?    No data found.  Updated Vital Signs BP 98/65 (BP Location: Left Arm)   Pulse 87   Temp 98.8 F (37.1 C) (Oral)   Resp 16   Ht 5\' 2"  (1.575 m)   Wt 137 lb (62.1 kg)   LMP 08/25/2022   SpO2 96%   BMI 25.06 kg/m   Visual Acuity Right Eye  Distance:   Left Eye Distance:   Bilateral Distance:    Right Eye Near:   Left Eye Near:    Bilateral Near:     Physical Exam Vitals and nursing note reviewed.  Constitutional:      Appearance: Normal appearance. She is not ill-appearing.  HENT:     Head: Normocephalic and atraumatic.     Right Ear: Tympanic membrane, ear canal and external ear normal. There is no impacted cerumen.     Left Ear: Tympanic membrane, ear canal and external ear normal. There is no impacted cerumen.     Nose: Congestion and rhinorrhea present.     Comments: Nasal mucosa is erythematous and edematous with scant clear discharge in both nares.    Mouth/Throat:     Mouth: Mucous membranes are moist.     Pharynx: Oropharynx is clear. Posterior oropharyngeal erythema present. No oropharyngeal exudate.     Comments: Bilateral tonsillar pillars are erythematous and edematous but I do not appreciate any exudate.  Posterior oropharynx has erythema and injection with clear postnasal drip. Cardiovascular:     Rate and Rhythm: Normal rate.     Pulses: Normal pulses.     Heart sounds: Normal heart sounds. No murmur  heard.    No friction rub. No gallop.  Pulmonary:     Effort: Pulmonary effort is normal.     Breath sounds: Normal breath sounds. No wheezing, rhonchi or rales.  Musculoskeletal:     Cervical back: Normal range of motion and neck supple.  Lymphadenopathy:     Cervical: No cervical adenopathy.  Skin:    General: Skin is warm and dry.     Capillary Refill: Capillary refill takes less than 2 seconds.     Findings: No erythema or rash.  Neurological:     General: No focal deficit present.     Mental Status: She is alert and oriented to person, place, and time.  Psychiatric:        Mood and Affect: Mood normal.        Behavior: Behavior normal.        Thought Content: Thought content normal.        Judgment: Judgment normal.      UC Treatments / Results  Labs (all labs ordered are listed, but only abnormal results are displayed) Labs Reviewed  GROUP A STREP BY PCR    EKG   Radiology No results found.  Procedures Procedures (including critical care time)  Medications Ordered in UC Medications - No data to display  Initial Impression / Assessment and Plan / UC Course  I have reviewed the triage vital signs and the nursing notes.  Pertinent labs & imaging results that were available during my care of the patient were reviewed by me and considered in my medical decision making (see chart for details).   Patient is a nontoxic-appearing 22 year old female here for evaluation of respiratory complaints as outlined in HPI above.  Her most significant complaint is that of a sore throat and right ear pain.  Both the symptoms have been going on for last 3 days but they are not associated with fever.  She reports that she has had a nonproductive cough for the past week and that many of her coworkers are also coughing.  On exam patient does have some inflammation of her nasal mucosa with scant clear discharge in both nares.  Bilateral tonsillar pillars are 2+ edematous with erythema  but no exudate.  Posterior oropharynx does  demonstrate erythema and injection with clear postnasal drip.  Cardiopulmonary exam reveals lung sounds in all fields.  I will order a strep PCR to look for the presence of strep but I suspect that the patient has a viral upper respiratory infection.  Given that her symptoms have been present for a week I will not order a COVID or strep PCR as she is outside the window for quarantine or antiviral therapy.  Strep PCR is negative.  I will discharge patient with a diagnosis of viral URI with cough.  I will prescribe Atrovent nasal spray to up with nasal congestion along with Tessalon Perles and Promethazine DM cough syrup.  She can use over-the-counter Tylenol and ibuprofen as needed for discomfort.  Salt water gargles help soothe the throat as well as over-the-counter Chloraseptic or Sucrets lozenges.  Voice rest and cool or warm fluids as tolerated.  Return precautions reviewed.  Work note provided.   Final Clinical Impressions(s) / UC Diagnoses   Final diagnoses:  Viral URI with cough     Discharge Instructions      Your test for strep today was negative.  I do believe you have a viral respiratory infection that is contributing to your symptoms.  Use over-the-counter Tylenol and ibuprofen according to package instructions as needed for fever or pain.  Gargle with warm salt water, 1 tablespoon of table salt in 8 ounces of warm water, gargle and spit as needed for throat pain.  You can use over-the-counter Chloraseptic and Sucrets lozenges to help you with sore throat pain as well.  Do not use more than 1 lozenge every 2 hours as the menthol may give you diarrhea.  Use the Atrovent nasal spray, 2 squirts in each nostril every 6 hours, as needed for runny nose and postnasal drip.  Use the Tessalon Perles every 8 hours during the day.  Take them with a small sip of water.  They may give you some numbness to the base of your tongue or a metallic taste  in your mouth, this is normal.  Use the Promethazine DM cough syrup at bedtime for cough and congestion.  It will make you drowsy so do not take it during the day.  Rest your voice is much as possible to help avoid any further loss of voice.  You may also consider sipping frequent warm or cool beverages, whichever makes your throat feel better, to help maintain lubrication and decrease inflammation in your throat.  Return for reevaluation or see your primary care provider for any new or worsening symptoms.      ED Prescriptions     Medication Sig Dispense Auth. Provider   benzonatate (TESSALON) 100 MG capsule Take 2 capsules (200 mg total) by mouth every 8 (eight) hours. 21 capsule Becky Augusta, NP   ipratropium (ATROVENT) 0.06 % nasal spray Place 2 sprays into both nostrils 4 (four) times daily. 15 mL Becky Augusta, NP   promethazine-dextromethorphan (PROMETHAZINE-DM) 6.25-15 MG/5ML syrup Take 5 mLs by mouth 4 (four) times daily as needed. 118 mL Becky Augusta, NP      PDMP not reviewed this encounter.   Becky Augusta, NP 09/10/22 [phone]

## 2022-09-10 NOTE — ED Triage Notes (Signed)
Patient reports sore throat, congestion and right ear pain x day 3.

## 2022-09-22 ENCOUNTER — Ambulatory Visit
Admission: EM | Admit: 2022-09-22 | Discharge: 2022-09-22 | Disposition: A | Discharge status: Home / Self Care | Payer: Medicaid Other | Attending: Physician Assistant | Admitting: Physician Assistant

## 2022-09-22 ENCOUNTER — Encounter: Payer: Self-pay | Admitting: Emergency Medicine

## 2022-09-22 DIAGNOSIS — J069 Acute upper respiratory infection, unspecified: Secondary | ICD-10-CM | POA: Diagnosis not present

## 2022-09-22 DIAGNOSIS — Z1152 Encounter for screening for COVID-19: Secondary | ICD-10-CM | POA: Diagnosis not present

## 2022-09-22 LAB — RESP PANEL BY RT-PCR (RSV, FLU A&B, COVID)  RVPGX2
Influenza A by PCR: NEGATIVE
Influenza B by PCR: NEGATIVE
Resp Syncytial Virus by PCR: NEGATIVE
SARS Coronavirus 2 by RT PCR: NEGATIVE

## 2022-09-22 MED ORDER — AMOXICILLIN 500 MG PO CAPS: 500.0000 mg | ORAL_CAPSULE | Freq: Two times a day (BID) | ORAL | 0 refills | Status: AC

## 2022-09-22 MED ORDER — ACETAMINOPHEN 325 MG PO TABS
975.0000 mg | ORAL_TABLET | Freq: Four times a day (QID) | ORAL | Status: DC | PRN
  Administered 2022-09-22: 975 mg via ORAL

## 2022-09-22 MED ORDER — PROMETHAZINE-DM 6.25-15 MG/5ML PO SYRP: 5.0000 mL | ORAL_SOLUTION | Freq: Four times a day (QID) | ORAL | 0 refills | Status: DC | PRN

## 2022-09-22 NOTE — Discharge Instructions (Signed)
Flu and RSV testing negative  As your cough and congestion has been present for 2 weeks and you are beginning to have new symptoms we will provide coverage for bacteria which may be causing this to prolong  Take amoxicillin every morning and every evening for 7 days then daily you will see an improvement in about 48 hours  You may use cough syrup every 6 hours for management of nausea and coughing, be mindful this may make you feel sleepy    You can take Tylenol and/or Ibuprofen as needed for fever reduction and pain relief.   For cough: honey 1/2 to 1 teaspoon (you can dilute the honey in water or another fluid).  You can also use guaifenesin and dextromethorphan for cough. You can use a humidifier for chest congestion and cough.  If you don't have a humidifier, you can sit in the bathroom with the hot shower running.      For sore throat: try warm salt water gargles, cepacol lozenges, throat spray, warm tea or water with lemon/honey, popsicles or ice, or OTC cold relief medicine for throat discomfort.   For congestion: take a daily anti-histamine like Zyrtec, Claritin, and a oral decongestant, such as pseudoephedrine.  You can also use Flonase 1-2 sprays in each nostril daily.   It is important to stay hydrated: drink plenty of fluids (water, gatorade/powerade/pedialyte, juices, or teas) to keep your throat moisturized and help further relieve irritation/discomfort.

## 2022-09-22 NOTE — ED Triage Notes (Signed)
Pt c/o body aches, cough, headache, fever, nasal congestion and runny nose. She states the has had the cough and congestion for about 2 weeks but the fever and body aches started yesterday.

## 2022-09-22 NOTE — ED Provider Notes (Signed)
[facility]    CSN: [id number] Arrival date & time: 09/22/22  0804      History   Chief Complaint Chief Complaint  Patient presents with   Generalized Body Aches    HPI Lauren Torres is a 22 y.o. female.   For evaluation of nasal congestion, rhinorrhea and a nonproductive cough present for 2 weeks, began to experience fever, chills and bodyaches starting overnight.  Has had accompanying sore throat occurring intermittently and experience bilateral ear popping only with blowing the nose.  Fever peaking at 100.6.  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  Has been tolerating food and liquids.  Has attempted use of Tylenol for management which has been ineffective.  Denies respiratory history.  Non-smoker.  Denies shortness of breath, wheezing.  History reviewed. No pertinent past medical history.  There are no problems to display for this patient.   Past Surgical History:  Procedure Laterality Date   WISDOM TOOTH EXTRACTION      OB History   No obstetric history on file.      Home Medications    Prior to Admission medications   Medication Sig Start Date End Date Taking? Authorizing Provider  benzonatate (TESSALON) 100 MG capsule Take 2 capsules (200 mg total) by mouth every 8 (eight) hours. 09/10/22   Margarette Canada, NP  ipratropium (ATROVENT) 0.06 % nasal spray Place 2 sprays into both nostrils 4 (four) times daily. 09/10/22   Margarette Canada, NP  promethazine-dextromethorphan (PROMETHAZINE-DM) 6.25-15 MG/5ML syrup Take 5 mLs by mouth 4 (four) times daily as needed. 09/10/22   Margarette Canada, NP  dicyclomine (BENTYL) 10 MG capsule Take 1 capsule (10 mg total) by mouth 4 (four) times daily. 03/10/17 03/04/20  Loney Hering, MD    Family History Family History  Problem Relation Age of Onset   Healthy Mother    Healthy Father     Social History Social History   Tobacco Use   Smoking status: Never   Smokeless tobacco: Never  Vaping Use   Vaping  Use: Never used  Substance Use Topics   Alcohol use: No   Drug use: No     Allergies   Patient has no known allergies.   Review of Systems Review of Systems  Constitutional:  Positive for chills and fever. Negative for activity change, appetite change, diaphoresis, fatigue and unexpected weight change.  HENT:  Positive for congestion, rhinorrhea and sore throat. Negative for dental problem, drooling, ear discharge, ear pain, facial swelling, hearing loss, mouth sores, nosebleeds, postnasal drip, sinus pressure, sinus pain, sneezing, tinnitus, trouble swallowing and voice change.   Respiratory: Negative.    Cardiovascular: Negative.  Negative for chest pain, palpitations and leg swelling.  Gastrointestinal:  Positive for nausea. Negative for abdominal distention, abdominal pain, anal bleeding, blood in stool, constipation, diarrhea, rectal pain and vomiting.  Musculoskeletal:  Positive for myalgias. Negative for arthralgias, back pain, gait problem, joint swelling, neck pain and neck stiffness.     Physical Exam Triage Vital Signs ED Triage Vitals  Enc Vitals Group     BP 09/22/22 0831 131/83     Pulse Rate 09/22/22 0831 (!) 125     Resp 09/22/22 0831 16     Temp 09/22/22 0831 (!) 101.8 F (38.8 C)     Temp Source 09/22/22 0831 Oral     SpO2 09/22/22 0831 97 %     Weight 09/22/22 0830 136 lb 14.5 oz (62.1 kg)     Height  09/22/22 0830 5\' 2"  (1.575 m)     Head Circumference --      Peak Flow --      Pain Score 09/22/22 0829 6     Pain Loc --      Pain Edu? --      Excl. in [place]? --    No data found.  Updated Vital Signs BP 131/83 (BP Location: Left Arm)   Pulse (!) 125   Temp (!) 101.8 F (38.8 C) (Oral)   Resp 16   Ht 5\' 2"  (1.575 m)   Wt 136 lb 14.5 oz (62.1 kg)   LMP 09/18/2022 (Approximate)   SpO2 97%   BMI 25.04 kg/m   Visual Acuity Right Eye Distance:   Left Eye Distance:   Bilateral Distance:    Right Eye Near:   Left Eye Near:    Bilateral Near:      Physical Exam Constitutional:      Appearance: Normal appearance.  HENT:     Head: Normocephalic.     Right Ear: Tympanic membrane, ear canal and external ear normal.     Left Ear: Tympanic membrane, ear canal and external ear normal.     Nose: Congestion and rhinorrhea present.     Mouth/Throat:     Pharynx: No posterior oropharyngeal erythema.     Tonsils: No tonsillar exudate. 3+ on the right. 3+ on the left.  Cardiovascular:     Rate and Rhythm: Normal rate and regular rhythm.     Pulses: Normal pulses.     Heart sounds: Normal heart sounds.  Pulmonary:     Effort: Pulmonary effort is normal.     Breath sounds: Normal breath sounds.  Skin:    General: Skin is warm and dry.  Neurological:     Mental Status: She is alert and oriented to person, place, and time. Mental status is at baseline.  Psychiatric:        Mood and Affect: Mood normal.        Behavior: Behavior normal.      UC Treatments / Results  Labs (all labs ordered are listed, but only abnormal results are displayed) Labs Reviewed  RESP PANEL BY RT-PCR (RSV, FLU A&B, COVID)  [id number]    EKG   Radiology No results found.  Procedures Procedures (including critical care time)  Medications Ordered in UC Medications  acetaminophen (TYLENOL) tablet 975 mg (975 mg Oral Given 09/22/22 0835)    Initial Impression / Assessment and Plan / UC Course  I have reviewed the triage vital signs and the nursing notes.  Pertinent labs & imaging results that were available during my care of the patient were reviewed by me and considered in my medical decision making (see chart for details).  Acute upper respiratory infection  Fever of 101.8 with associated tachycardia noted in triage, while ill-appearing patient is in no signs of distress lungs are clear to auscultation and O2 saturation 90, low suspicion for pneumonia or bronchitis at this time, COVID, flu and RSV testing negative was evaluated on 09/10/2022 for  symptoms when they initially began, strep testing negative at that time, on exam tonsillar adenopathy without exudate or erythema is present, patient endorses that tonsils are enlarged at baseline and she has been told this in the past several times, as symptoms have been present for 11 days without signs of resolution new symptoms or beginning we will provide coverage for bacteria, also discussed possible exposure to new viruses due to work,  amoxicillin prescribed as well as Promethazine DM which she endorses has been helpful in the past, may attempt use of additional over-the-counter medications as needed with urgent care follow-up as needed work note given Final Clinical Impressions(s) / UC Diagnoses   Final diagnoses:  None   Discharge Instructions   None    ED Prescriptions   None    PDMP not reviewed this encounter.   Hans Eden, [place] 09/22/22 [phone]

## 2023-01-09 ENCOUNTER — Ambulatory Visit
Admission: RE | Admit: 2023-01-09 | Discharge: 2023-01-09 | Disposition: A | Discharge status: Home / Self Care | Payer: Medicaid Other | Source: Ambulatory Visit | Attending: Family Medicine | Admitting: Family Medicine

## 2023-01-09 VITALS — BP 133/86 | HR 84 | Temp 98.7°F | Resp 16 | Ht 62.0 in | Wt 138.0 lb

## 2023-01-09 DIAGNOSIS — B3731 Acute candidiasis of vulva and vagina: Secondary | ICD-10-CM | POA: Diagnosis not present

## 2023-01-09 DIAGNOSIS — R300 Dysuria: Secondary | ICD-10-CM

## 2023-01-09 LAB — URINALYSIS, W/ REFLEX TO CULTURE (INFECTION SUSPECTED)
Bilirubin Urine: NEGATIVE
Glucose, UA: NEGATIVE mg/dL
Ketones, ur: NEGATIVE mg/dL
Leukocytes,Ua: NEGATIVE
Nitrite: NEGATIVE
Protein, ur: NEGATIVE mg/dL
Specific Gravity, Urine: 1.02 (ref 1.005–1.030)
WBC, UA: NONE SEEN WBC/hpf (ref 0–5)
pH: 6 (ref 5.0–8.0)

## 2023-01-09 LAB — WET PREP, GENITAL
Clue Cells Wet Prep HPF POC: NONE SEEN
Sperm: NONE SEEN
Trich, Wet Prep: NONE SEEN
WBC, Wet Prep HPF POC: <10 — AB (ref <10)

## 2023-01-09 MED ORDER — FLUCONAZOLE 150 MG PO TABS: 150.0000 mg | ORAL_TABLET | ORAL | 0 refills | Status: AC

## 2023-01-09 NOTE — ED Provider Notes (Signed)
[facility]    CSN: [id number] Arrival date & time: 01/09/23  1720      History   Chief Complaint Chief Complaint  Patient presents with   Vaginal Itching    Appt   Dysuria     HPI HPI Lauren Torres is a 22 y.o. female.    [facility] presents for vaginal itching that started on the second day of her period. Has some dysuria. She is currently menstruating.  Tried nothing prior to arrival.  Has  not had any antibiotics in last 30 days.   Denies known STI exposure.   Reports no symptoms in her partner. Lauren Torres does use condoms regularly. She is  not currently pregnant.  Patient's last menstrual period was 01/05/2023. She has irregular period.   - Abnormal vaginal discharge: white - vaginal bleeding: currently menstruating  - Dysuria: yes - Hematuria: with period  - Urinary urgency: no - Urinary frequency: no  - Fever: no - Abdominal pain no - Pelvic pain: yes - Rash/Skin lesions/mouth ulcers: no - Nausea: no  - Vomiting: no  - Back Pain: yes        History reviewed. No pertinent past medical history.  There are no problems to display for this patient.   Past Surgical History:  Procedure Laterality Date   WISDOM TOOTH EXTRACTION      OB History   No obstetric history on file.      Home Medications    Prior to Admission medications   Medication Sig Start Date End Date Taking? Authorizing Provider  fluconazole (DIFLUCAN) 150 MG tablet Take 1 tablet (150 mg total) by mouth every 3 (three) days for 2 doses. 01/09/23 01/13/23 Yes Sabryn Preslar, DO  dicyclomine (BENTYL) 10 MG capsule Take 1 capsule (10 mg total) by mouth 4 (four) times daily. 03/10/17 03/04/20  Rebecka Apley, MD    Family History Family History  Problem Relation Age of Onset   Healthy Mother    Healthy Father     Social History Social History   Tobacco Use   Smoking status: Never   Smokeless tobacco: Never  Vaping Use   Vaping Use: Never used   Substance Use Topics   Alcohol use: No   Drug use: No     Allergies   Patient has no known allergies.   Review of Systems Review of Systems: :negative unless otherwise stated in HPI.      Physical Exam Triage Vital Signs ED Triage Vitals  Enc Vitals Group     BP 01/09/23 1757 133/86     Pulse Rate 01/09/23 1757 84     Resp 01/09/23 1757 16     Temp 01/09/23 1757 98.7 F (37.1 C)     Temp Source 01/09/23 1757 Oral     SpO2 01/09/23 1757 99 %     Weight 01/09/23 1756 138 lb (62.6 kg)     Height 01/09/23 1756 5\' 2"  (1.575 m)     Head Circumference --      Peak Flow --      Pain Score 01/09/23 1801 0     Pain Loc --      Pain Edu? --      Excl. in [place]? --    No data found.  Updated Vital Signs BP 133/86 (BP Location: Right Arm)   Pulse 84   Temp 98.7 F (37.1 C) (Oral)   Resp 16   Ht 5\' 2"  (1.575 m)   Wt  62.6 kg   LMP 01/05/2023   SpO2 99%   BMI 25.24 kg/m   Visual Acuity Right Eye Distance:   Left Eye Distance:   Bilateral Distance:    Right Eye Near:   Left Eye Near:    Bilateral Near:     Physical Exam GEN: well appearing female in no acute distress  CVS: well perfused  RESP: speaking in full sentences without pause  GU: deferred, patient performed self swab     UC Treatments / Results  Labs (all labs ordered are listed, but only abnormal results are displayed) Labs Reviewed  WET PREP, GENITAL - Abnormal; Notable for the following components:      Result Value   Yeast Wet Prep HPF POC PRESENT (*)    WBC, Wet Prep HPF POC <10 (*)    All other components within normal limits  URINALYSIS, W/ REFLEX TO CULTURE (INFECTION SUSPECTED) - Abnormal; Notable for the following components:   Hgb urine dipstick SMALL (*)    Bacteria, UA RARE (*)    All other components within normal limits    EKG   Radiology No results found.  Procedures Procedures (including critical care time)  Medications Ordered in UC Medications - No data to  display  Initial Impression / Assessment and Plan / UC Course  I have reviewed the triage vital signs and the nursing notes.  Pertinent labs & imaging results that were available during my care of the patient were reviewed by me and considered in my medical decision making (see chart for details).      Patient is a 23 y.o.Marland Kitchen female  who presents for vaginal itching and dysuria.  Overall patient is well-appearing and afebrile.  Vital signs stable.  UA  not consistent with acute cystitis.   Hematuria likely due to menstruation. Yeast vaginitis as yeast seen with urinalysis and confirmed on wet prep. Diflucan for 2 doses for yeast infection    Return precautions including abdominal pain, fever, chills, nausea, or vomiting given. Discussed MDM, treatment plan and plan for follow-up with patient/parent who agrees with plan.      Final Clinical Impressions(s) / UC Diagnoses   Final diagnoses:  Dysuria  Yeast vaginitis     Discharge Instructions      Stop by the pharmacy to pick up your prescriptions.  Follow up with your primary care provider as needed.      ED Prescriptions     Medication Sig Dispense Auth. Provider   fluconazole (DIFLUCAN) 150 MG tablet Take 1 tablet (150 mg total) by mouth every 3 (three) days for 2 doses. 2 tablet Katha Cabal, DO      PDMP not reviewed this encounter.   Katha Cabal, DO 01/09/23 1834

## 2023-01-09 NOTE — ED Triage Notes (Signed)
Pt c/o vaginal itching & burning after urination x4 days. Denies any hematuria, but actively on her period. No otc tx.

## 2023-01-09 NOTE — Discharge Instructions (Addendum)
Stop by the pharmacy to pick up your prescriptions.  Follow up with your primary care provider as needed.  

## 2023-01-13 ENCOUNTER — Ambulatory Visit
Admission: RE | Admit: 2023-01-13 | Discharge: 2023-01-13 | Disposition: A | Discharge status: Home / Self Care | Payer: Medicaid Other | Source: Ambulatory Visit | Attending: Emergency Medicine

## 2023-01-13 VITALS — BP 111/78 | HR 79 | Temp 98.5°F | Resp 17

## 2023-01-13 DIAGNOSIS — R197 Diarrhea, unspecified: Secondary | ICD-10-CM | POA: Diagnosis not present

## 2023-01-13 DIAGNOSIS — N898 Other specified noninflammatory disorders of vagina: Secondary | ICD-10-CM | POA: Insufficient documentation

## 2023-01-13 DIAGNOSIS — Z3202 Encounter for pregnancy test, result negative: Secondary | ICD-10-CM | POA: Insufficient documentation

## 2023-01-13 DIAGNOSIS — R112 Nausea with vomiting, unspecified: Secondary | ICD-10-CM | POA: Diagnosis not present

## 2023-01-13 DIAGNOSIS — B349 Viral infection, unspecified: Secondary | ICD-10-CM

## 2023-01-13 LAB — POCT URINE PREGNANCY: Preg Test, Ur: NEGATIVE

## 2023-01-13 LAB — POCT RAPID STREP A (OFFICE): Rapid Strep A Screen: NEGATIVE

## 2023-01-13 MED ORDER — ONDANSETRON 4 MG PO TBDP: 4.0000 mg | ORAL_TABLET | Freq: Three times a day (TID) | ORAL | 0 refills | Status: DC | PRN

## 2023-01-13 NOTE — ED Provider Notes (Signed)
Renaldo Fiddler    CSN: [id number] Arrival date & time: 01/13/23  0932      History   Chief Complaint Chief Complaint  Patient presents with   Vaginal Itching   Sore Throat    HPI Lauren Torres is a 22 y.o. female.  Patient presents with sore throat, congestion, runny nose, postnasal drip, sneezing, vomiting, diarrhea x 2-3 days.  She also reports vaginal itching x 1 week.  No OTC medications taken today.  No fever, abdominal pain at this time, chest pain, shortness of breath, vaginal discharge, pelvic pain, dysuria,  or other symptoms.  No emesis or diarrhea today.  No OTC medications taken today.    Patient was seen at [facility] urgent care on 01/09/2023; diagnosed with dysuria and yeast infection; treated with Diflucan.  Patient states she was only able to take 1 dose due to dizziness, emesis, abdominal pain; the second dose is due today.  The history is provided by the patient and medical records.    History reviewed. No pertinent past medical history.  There are no problems to display for this patient.   Past Surgical History:  Procedure Laterality Date   WISDOM TOOTH EXTRACTION      OB History   No obstetric history on file.      Home Medications    Prior to Admission medications   Medication Sig Start Date End Date Taking? Authorizing Provider  ondansetron (ZOFRAN-ODT) 4 MG disintegrating tablet Take 1 tablet (4 mg total) by mouth every 8 (eight) hours as needed for nausea or vomiting. 01/13/23  Yes Mickie Bail, NP  fluconazole (DIFLUCAN) 150 MG tablet Take 1 tablet (150 mg total) by mouth every 3 (three) days for 2 doses. Patient not taking: Reported on 01/13/2023 01/09/23 01/13/23  Katha Cabal, DO  dicyclomine (BENTYL) 10 MG capsule Take 1 capsule (10 mg total) by mouth 4 (four) times daily. 03/10/17 03/04/20  Rebecka Apley, MD    Family History Family History  Problem Relation Age of Onset   Healthy Mother    Healthy Father     Social  History Social History   Tobacco Use   Smoking status: Never   Smokeless tobacco: Never  Vaping Use   Vaping Use: Never used  Substance Use Topics   Alcohol use: No   Drug use: No     Allergies   Patient has no known allergies.   Review of Systems Review of Systems  Constitutional:  Negative for chills and fever.  HENT:  Positive for congestion, postnasal drip, rhinorrhea, sneezing and sore throat. Negative for ear pain.   Respiratory:  Negative for cough and shortness of breath.   Cardiovascular:  Negative for chest pain and palpitations.  Gastrointestinal:  Positive for diarrhea, nausea and vomiting. Negative for abdominal pain.  Genitourinary:  Negative for dysuria, flank pain, frequency, hematuria, pelvic pain and vaginal discharge.       Vaginal itching.  Skin:  Negative for rash.  All other systems reviewed and are negative.    Physical Exam Triage Vital Signs ED Triage Vitals  Enc Vitals Group     BP 01/13/23 0945 111/78     Pulse Rate 01/13/23 0945 79     Resp 01/13/23 0945 17     Temp 01/13/23 0945 98.5 F (36.9 C)     Temp src --      SpO2 01/13/23 0945 98 %     Weight --      Height --  Head Circumference --      Peak Flow --      Pain Score 01/13/23 0943 5     Pain Loc --      Pain Edu? --      Excl. in GC? --    No data found.  Updated Vital Signs BP 111/78   Pulse 79   Temp 98.5 F (36.9 C)   Resp 17   LMP 01/05/2023   SpO2 98%   Visual Acuity Right Eye Distance:   Left Eye Distance:   Bilateral Distance:    Right Eye Near:   Left Eye Near:    Bilateral Near:     Physical Exam Vitals and nursing note reviewed.  Constitutional:      General: She is not in acute distress.    Appearance: Normal appearance. She is well-developed. She is not ill-appearing.  HENT:     Right Ear: Tympanic membrane normal.     Left Ear: Tympanic membrane normal.     Nose: Congestion and rhinorrhea present.     Mouth/Throat:     Mouth: Mucous  membranes are moist.     Pharynx: Oropharynx is clear.     Comments: Clear PND. Cardiovascular:     Rate and Rhythm: Normal rate and regular rhythm.     Heart sounds: Normal heart sounds.  Pulmonary:     Effort: Pulmonary effort is normal. No respiratory distress.     Breath sounds: Normal breath sounds.  Abdominal:     General: Bowel sounds are normal.     Palpations: Abdomen is soft.     Tenderness: There is no abdominal tenderness. There is no right CVA tenderness, left CVA tenderness, guarding or rebound.  Musculoskeletal:     Cervical back: Neck supple.  Skin:    General: Skin is warm and dry.  Neurological:     Mental Status: She is alert.  Psychiatric:        Mood and Affect: Mood normal.        Behavior: Behavior normal.      UC Treatments / Results  Labs (all labs ordered are listed, but only abnormal results are displayed) Labs Reviewed  POCT URINE PREGNANCY  POCT RAPID STREP A (OFFICE)  CERVICOVAGINAL ANCILLARY ONLY    EKG   Radiology No results found.  Procedures Procedures (including critical care time)  Medications Ordered in UC Medications - No data to display  Initial Impression / Assessment and Plan / UC Course  I have reviewed the triage vital signs and the nursing notes.  Pertinent labs & imaging results that were available during my care of the patient were reviewed by me and considered in my medical decision making (see chart for details).    Vaginal itching, viral illness, nausea vomiting and diarrhea, negative pregnancy test.  Afebrile and vital signs are stable.  Abdomen is soft and nontender with good bowel sounds.  Patient has clear postnasal drip and nasal congestion.  Rapid strep negative.  Discussed with patient that her symptoms are likely viral.  Treating her nausea and vomiting with Zofran.  Discussed that she could attempt taking the second dose of Diflucan after taking Zofran and making sure that she has food on her stomach.  She  is agreeable to attempt this and declines vaginal Monistat prescription.  Discussed maintaining oral hydration with clear liquids and advance to diarrhea diet as ED precautions given.  Education provided on viral illness, nausea vomiting, diarrhea.  Instructed patient to follow-up  with her PCP.  She agrees to plan of care.  Final Clinical Impressions(s) / UC Diagnoses   Final diagnoses:  Vaginal itching  Viral illness  Nausea vomiting and diarrhea  Negative pregnancy test     Discharge Instructions      The strep test is negative.  Urine pregnancy test is negative.  Take the antinausea medication as directed.  Keep yourself hydrated with clear liquids, such as water and Gatorade.  Follow the diarrhea diet as tolerated.   Go to the emergency department if you have worsening symptoms.    Follow up with your primary care provider.          ED Prescriptions     Medication Sig Dispense Auth. Provider   ondansetron (ZOFRAN-ODT) 4 MG disintegrating tablet Take 1 tablet (4 mg total) by mouth every 8 (eight) hours as needed for nausea or vomiting. 20 tablet Mickie Bail, NP      PDMP not reviewed this encounter.   Mickie Bail, NP 01/13/23 1028

## 2023-01-13 NOTE — Discharge Instructions (Addendum)
The strep test is negative.  Urine pregnancy test is negative.  Take the antinausea medication as directed.  Keep yourself hydrated with clear liquids, such as water and Gatorade.  Follow the diarrhea diet as tolerated.   Go to the emergency department if you have worsening symptoms.    Follow up with your primary care provider.

## 2023-01-13 NOTE — ED Triage Notes (Addendum)
Patient to Urgent Care with complaints of vaginal itching x1 week. Also reports sore throat that started two days ago. Also reports sneezing. Denies any known fevers.    Reports she was seen at another Urgent Care 5/13 and treated with diflucan but this did not relieve her symptoms. Reports the medication caused her to feel dizzy/ have abdominal pain/ vomiting. Was only able to tolerate one dose.

## 2023-01-16 LAB — CERVICOVAGINAL ANCILLARY ONLY
Bacterial Vaginitis (gardnerella): NEGATIVE
Candida Glabrata: NEGATIVE
Candida Vaginitis: NEGATIVE
Chlamydia: POSITIVE — AB
Comment: NEGATIVE
Comment: NEGATIVE
Comment: NEGATIVE
Comment: NEGATIVE
Comment: NEGATIVE
Comment: NORMAL
Neisseria Gonorrhea: NEGATIVE
Trichomonas: NEGATIVE

## 2023-01-17 ENCOUNTER — Ambulatory Visit
Admission: RE | Admit: 2023-01-17 | Discharge: 2023-01-17 | Disposition: A | Discharge status: Home / Self Care | Payer: Medicaid Other | Source: Ambulatory Visit

## 2023-01-17 ENCOUNTER — Telehealth (HOSPITAL_COMMUNITY): Payer: Self-pay | Admitting: Emergency Medicine

## 2023-01-17 MED ORDER — DOXYCYCLINE HYCLATE 100 MG PO CAPS: 100.0000 mg | ORAL_CAPSULE | Freq: Two times a day (BID) | ORAL | 0 refills | Status: AC

## 2023-01-17 NOTE — ED Triage Notes (Signed)
Patient presents to [facility] for follow-up appt. Was seen 05/17 for STD screening. States she has not had anyone follow-up with test results. Chart reviewed and results reviewed with patient. Safe sex practices reviewed with patient. Informed patient that provider will notify our call-back nurse. Voiced understanding.

## 2023-01-18 DIAGNOSIS — Z7251 High risk heterosexual behavior: Secondary | ICD-10-CM | POA: Diagnosis not present

## 2023-03-01 ENCOUNTER — Ambulatory Visit (INDEPENDENT_AMBULATORY_CARE_PROVIDER_SITE_OTHER): Payer: Medicaid Other | Admitting: Family Medicine

## 2023-03-01 ENCOUNTER — Other Ambulatory Visit (HOSPITAL_COMMUNITY)
Admission: RE | Admit: 2023-03-01 | Discharge: 2023-03-01 | Disposition: A | Discharge status: Home / Self Care | Payer: Medicaid Other | Source: Ambulatory Visit | Attending: Family Medicine | Admitting: Family Medicine

## 2023-03-01 ENCOUNTER — Encounter: Payer: Self-pay | Admitting: Family Medicine

## 2023-03-01 VITALS — BP 111/77 | HR 74 | Temp 98.8°F | Ht 63.39 in | Wt 135.0 lb

## 2023-03-01 DIAGNOSIS — Z0000 Encounter for general adult medical examination without abnormal findings: Secondary | ICD-10-CM

## 2023-03-01 LAB — URINALYSIS, ROUTINE W REFLEX MICROSCOPIC
Bilirubin, UA: NEGATIVE
Glucose, UA: NEGATIVE
Ketones, UA: NEGATIVE
Leukocytes,UA: NEGATIVE
Nitrite, UA: NEGATIVE
Specific Gravity, UA: 1.015 (ref 1.005–1.030)
Urobilinogen, Ur: 0.2 mg/dL (ref 0.2–1.0)
pH, UA: 8.5 — ABNORMAL HIGH (ref 5.0–7.5)

## 2023-03-01 LAB — MICROSCOPIC EXAMINATION: Bacteria, UA: NONE SEEN

## 2023-03-01 NOTE — Progress Notes (Signed)
BP 111/77   Pulse 74   Temp 98.8 F (37.1 C) (Oral)   Ht 5' 3.39" (1.61 m)   Wt 135 lb (61.2 kg)   LMP 12/21/2022 (Approximate)   SpO2 98%   BMI 23.62 kg/m    Subjective:    Patient ID: Lauren Torres, female    DOB: 05-31-2001, 22 y.o.   MRN: [id number]  HPI: Deliah Braxton Sledge is a 22 y.o. female presenting on 03/01/2023  to establish care and for comprehensive medical examination. Current medical complaints include: Had chlamydia a few weeks ago and would like to make sure it's gone.  Menopausal Symptoms: no  Depression Screen done today and results listed below:     03/01/2023   11:08 AM  Depression screen PHQ 2/9  Decreased Interest 1  Down, Depressed, Hopeless 2  PHQ - 2 Score 3  Altered sleeping 3  Tired, decreased energy 2  Change in appetite 1  Feeling bad or failure about yourself  0  Trouble concentrating 0  Moving slowly or fidgety/restless 0  Suicidal thoughts 0  PHQ-9 Score 9  Difficult doing work/chores Somewhat difficult   Past Medical History:  History reviewed. No pertinent past medical history.  Surgical History:  Past Surgical History:  Procedure Laterality Date   WISDOM TOOTH EXTRACTION      Medications:  Current Outpatient Medications on File Prior to Visit  Medication Sig   ondansetron (ZOFRAN-ODT) 4 MG disintegrating tablet Take 1 tablet (4 mg total) by mouth every 8 (eight) hours as needed for nausea or vomiting. (Patient not taking: Reported on 03/01/2023)   [DISCONTINUED] dicyclomine (BENTYL) 10 MG capsule Take 1 capsule (10 mg total) by mouth 4 (four) times daily.   No current facility-administered medications on file prior to visit.    Allergies:  No Known Allergies  Social History:  Social History   Socioeconomic History   Marital status: Single    Spouse name: Not on file   Number of children: Not on file   Years of education: Not on file   Highest education level: Not on file  Occupational History   Not on file   Tobacco Use   Smoking status: Never   Smokeless tobacco: Never  Vaping Use   Vaping Use: Never used  Substance and Sexual Activity   Alcohol use: No   Drug use: No   Sexual activity: Not Currently  Other Topics Concern   Not on file  Social History Narrative   Not on file   Social Determinants of Health   Financial Resource Strain: Not on file  Food Insecurity: Not on file  Transportation Needs: Not on file  Physical Activity: Not on file  Stress: Not on file  Social Connections: Not on file  Intimate Partner Violence: Not on file   Social History   Tobacco Use  Smoking Status Never  Smokeless Tobacco Never   Social History   Substance and Sexual Activity  Alcohol Use No    Family History:  Family History  Problem Relation Age of Onset   Healthy Mother    Healthy Father    Diabetes Maternal Grandmother    Diabetes Maternal Grandfather    Diabetes Paternal Grandmother    Diabetes Paternal Grandfather     Past medical history, surgical history, medications, allergies, family history and social history reviewed with patient today and changes made to appropriate areas of the chart.   Review of Systems  Constitutional: Negative.   HENT: Negative.  Eyes: Negative.   Respiratory: Negative.    Cardiovascular: Negative.   Gastrointestinal:  Positive for heartburn. Negative for abdominal pain, blood in stool, constipation, diarrhea, melena, nausea and vomiting.  Genitourinary:  Positive for dysuria. Negative for flank pain, frequency, hematuria and urgency.  Musculoskeletal: Negative.   Skin: Negative.   Neurological: Negative.   Endo/Heme/Allergies:  Negative for environmental allergies and polydipsia. Bruises/bleeds easily.  Psychiatric/Behavioral: Negative.     All other ROS negative except what is listed above and in the HPI.      Objective:    BP 111/77   Pulse 74   Temp 98.8 F (37.1 C) (Oral)   Ht 5' 3.39" (1.61 m)   Wt 135 lb (61.2 kg)   LMP  12/21/2022 (Approximate)   SpO2 98%   BMI 23.62 kg/m   Wt Readings from Last 3 Encounters:  03/01/23 135 lb (61.2 kg)  01/09/23 138 lb (62.6 kg)  09/22/22 136 lb 14.5 oz (62.1 kg)    Physical Exam Vitals and nursing note reviewed. Exam conducted with a chaperone present.  Constitutional:      General: She is not in acute distress.    Appearance: Normal appearance. She is normal weight. She is not ill-appearing, toxic-appearing or diaphoretic.  HENT:     Head: Normocephalic and atraumatic.     Right Ear: Tympanic membrane, ear canal and external ear normal. There is no impacted cerumen.     Left Ear: Tympanic membrane, ear canal and external ear normal. There is no impacted cerumen.     Nose: Nose normal. No congestion or rhinorrhea.     Mouth/Throat:     Mouth: Mucous membranes are moist.     Pharynx: Oropharynx is clear. No oropharyngeal exudate or posterior oropharyngeal erythema.  Eyes:     General: No scleral icterus.       Right eye: No discharge.        Left eye: No discharge.     Extraocular Movements: Extraocular movements intact.     Conjunctiva/sclera: Conjunctivae normal.     Pupils: Pupils are equal, round, and reactive to light.  Neck:     Vascular: No carotid bruit.  Cardiovascular:     Rate and Rhythm: Normal rate and regular rhythm.     Pulses: Normal pulses.     Heart sounds: No murmur heard.    No friction rub. No gallop.  Pulmonary:     Effort: Pulmonary effort is normal. No respiratory distress.     Breath sounds: Normal breath sounds. No stridor. No wheezing, rhonchi or rales.  Chest:     Chest wall: No tenderness.  Breasts:    Right: Normal.     Left: Normal.  Abdominal:     General: Abdomen is flat. Bowel sounds are normal. There is no distension.     Palpations: Abdomen is soft. There is no mass.     Tenderness: There is no abdominal tenderness. There is no right CVA tenderness, left CVA tenderness, guarding or rebound.     Hernia: No hernia is  present.  Genitourinary:    Labia:        Right: No rash, tenderness, lesion or injury.        Left: No rash, tenderness, lesion or injury.      Vagina: Normal.     Cervix: Normal.     Uterus: Normal.      Adnexa: Right adnexa normal and left adnexa normal.  Musculoskeletal:  General: No swelling, tenderness, deformity or signs of injury.     Cervical back: Normal range of motion and neck supple. No rigidity. No muscular tenderness.     Right lower leg: No edema.     Left lower leg: No edema.  Lymphadenopathy:     Cervical: No cervical adenopathy.  Skin:    General: Skin is warm and dry.     Capillary Refill: Capillary refill takes less than 2 seconds.     Coloration: Skin is not jaundiced or pale.     Findings: No bruising, erythema, lesion or rash.  Neurological:     General: No focal deficit present.     Mental Status: She is alert and oriented to person, place, and time. Mental status is at baseline.     Cranial Nerves: No cranial nerve deficit.     Sensory: No sensory deficit.     Motor: No weakness.     Coordination: Coordination normal.     Gait: Gait normal.     Deep Tendon Reflexes: Reflexes normal.  Psychiatric:        Mood and Affect: Mood normal.        Behavior: Behavior normal.        Thought Content: Thought content normal.        Judgment: Judgment normal.     Results for orders placed or performed during the hospital encounter of 01/13/23  POCT urine pregnancy  Result Value Ref Range   Preg Test, Ur Negative Negative  POCT rapid strep A  Result Value Ref Range   Rapid Strep A Screen Negative Negative  Cervicovaginal ancillary only  Result Value Ref Range   Neisseria Gonorrhea Negative    Chlamydia Positive (A)    Trichomonas Negative    Bacterial Vaginitis (gardnerella) Negative    Candida Vaginitis Negative    Candida Glabrata Negative    Comment      Normal Reference Range Bacterial Vaginosis - Negative   Comment Normal Reference Range  Candida Species - Negative    Comment Normal Reference Range Candida Galbrata - Negative    Comment Normal Reference Range Trichomonas - Negative    Comment Normal Reference Ranger Chlamydia - Negative    Comment      Normal Reference Range Neisseria Gonorrhea - Negative      Assessment & Plan:   Problem List Items Addressed This Visit   None Visit Diagnoses     Routine general medical examination at a health care facility    -  Primary   Relevant Orders   CBC with Differential/Platelet   Comprehensive metabolic panel   Lipid Panel w/o Chol/HDL Ratio   Cytology - PAP   Urinalysis, Routine w reflex microscopic   TSH   GC/Chlamydia Probe Amp   HIV Antibody (routine testing w rflx)   Hepatitis C Antibody        Follow up plan: Return in about 1 year (around 02/29/2024) for physical.   LABORATORY TESTING:  - Pap smear: pap done  IMMUNIZATIONS:   - Tdap: Tetanus vaccination status reviewed: unsure- will check [facility]. - Influenza: Postponed to flu season - Pneumovax: Not applicable - Prevnar: Not applicable - COVID: Up to date - HPV:  unsure- will check [facility]. - Shingrix vaccine: Not applicable   PATIENT COUNSELING:   Advised to take 1 mg of folate supplement per day if capable of pregnancy.   Sexuality: Discussed sexually transmitted diseases, partner selection, use of condoms, avoidance of unintended pregnancy  and contraceptive alternatives.   Advised to avoid cigarette smoking.  I discussed with the patient that most people either abstain from alcohol or drink within safe limits (<=14/week and <=4 drinks/occasion for males, <=7/weeks and <= 3 drinks/occasion for females) and that the risk for alcohol disorders and other health effects rises proportionally with the number of drinks per week and how often a drinker exceeds daily limits.  Discussed cessation/primary prevention of drug use and availability of treatment for abuse.   Diet: Encouraged to adjust caloric  intake to maintain  or achieve ideal body weight, to reduce intake of dietary saturated fat and total fat, to limit sodium intake by avoiding high sodium foods and not adding table salt, and to maintain adequate dietary potassium and calcium preferably from fresh fruits, vegetables, and low-fat dairy products.    stressed the importance of regular exercise  Injury prevention: Discussed safety belts, safety helmets, smoke detector, smoking near bedding or upholstery.   Dental health: Discussed importance of regular tooth brushing, flossing, and dental visits.    NEXT PREVENTATIVE PHYSICAL DUE IN 1 YEAR. Return in about 1 year (around 02/29/2024) for physical.

## 2023-03-02 LAB — COMPREHENSIVE METABOLIC PANEL
ALT: 18 IU/L (ref 0–32)
AST: 20 IU/L (ref 0–40)
Albumin: 5 g/dL (ref 4.0–5.0)
Alkaline Phosphatase: 128 IU/L — ABNORMAL HIGH (ref 44–121)
BUN/Creatinine Ratio: 13 (ref 9–23)
BUN: 10 mg/dL (ref 6–20)
Bilirubin Total: 1.1 mg/dL (ref 0.0–1.2)
CO2: 24 mmol/L (ref 20–29)
Calcium: 9.7 mg/dL (ref 8.7–10.2)
Chloride: 99 mmol/L (ref 96–106)
Creatinine, Ser: 0.79 mg/dL (ref 0.57–1.00)
Globulin, Total: 3 g/dL (ref 1.5–4.5)
Glucose: 88 mg/dL (ref 70–99)
Potassium: 3.9 mmol/L (ref 3.5–5.2)
Sodium: 137 mmol/L (ref 134–144)
Total Protein: 8 g/dL (ref 6.0–8.5)
eGFR: 108 mL/min/{1.73_m2} (ref >59)

## 2023-03-02 LAB — LIPID PANEL W/O CHOL/HDL RATIO
Cholesterol, Total: 176 mg/dL (ref 100–199)
HDL: 47 mg/dL (ref >39)
LDL Chol Calc (NIH): 101 mg/dL — ABNORMAL HIGH (ref 0–99)
Triglycerides: 162 mg/dL — ABNORMAL HIGH (ref 0–149)
VLDL Cholesterol Cal: 28 mg/dL (ref 5–40)

## 2023-03-02 LAB — HEPATITIS C ANTIBODY: Hep C Virus Ab: NONREACTIVE

## 2023-03-02 LAB — HIV ANTIBODY (ROUTINE TESTING W REFLEX): HIV Screen 4th Generation wRfx: NONREACTIVE

## 2023-03-02 LAB — TSH: TSH: 0.965 u[IU]/mL (ref 0.450–4.500)

## 2023-03-02 LAB — CBC WITH DIFFERENTIAL/PLATELET
Basophils Absolute: 0 10*3/uL (ref 0.0–0.2)
Basos: 0 %
EOS (ABSOLUTE): 0.1 10*3/uL (ref 0.0–0.4)
Eos: 1 %
Hematocrit: 46.2 % (ref 34.0–46.6)
Hemoglobin: 14.9 g/dL (ref 11.1–15.9)
Immature Grans (Abs): 0 10*3/uL (ref 0.0–0.1)
Immature Granulocytes: 0 %
Lymphocytes Absolute: 2.5 10*3/uL (ref 0.7–3.1)
Lymphs: 31 %
MCH: 26.5 pg — ABNORMAL LOW (ref 26.6–33.0)
MCHC: 32.3 g/dL (ref 31.5–35.7)
MCV: 82 fL (ref 79–97)
Monocytes Absolute: 0.5 10*3/uL (ref 0.1–0.9)
Monocytes: 6 %
Neutrophils Absolute: 4.8 10*3/uL (ref 1.4–7.0)
Neutrophils: 62 %
Platelets: 343 10*3/uL (ref 150–450)
RBC: 5.63 x10E6/uL — ABNORMAL HIGH (ref 3.77–5.28)
RDW: 14.5 % (ref 11.7–15.4)
WBC: 7.9 10*3/uL (ref 3.4–10.8)

## 2023-03-03 LAB — GC/CHLAMYDIA PROBE AMP
Chlamydia trachomatis, NAA: NEGATIVE
Neisseria Gonorrhoeae by PCR: NEGATIVE

## 2023-03-03 NOTE — Progress Notes (Signed)
Contacted via MyChart   Good morning, your urine testing has returned negative.  Good news!!

## 2023-03-06 LAB — CYTOLOGY - PAP
Adequacy: ABSENT
Diagnosis: NEGATIVE

## 2023-03-06 NOTE — Progress Notes (Signed)
Contacted via MyChart   Negative pap smear!!  Woohoo!!  Repeat in 3 years.

## 2023-04-07 DIAGNOSIS — H5213 Myopia, bilateral: Secondary | ICD-10-CM | POA: Diagnosis not present

## 2023-04-16 ENCOUNTER — Ambulatory Visit
Admission: EM | Admit: 2023-04-16 | Discharge: 2023-04-16 | Disposition: A | Discharge status: Home / Self Care | Payer: Medicaid Other | Attending: Emergency Medicine | Admitting: Emergency Medicine

## 2023-04-16 DIAGNOSIS — U071 COVID-19: Secondary | ICD-10-CM | POA: Insufficient documentation

## 2023-04-16 DIAGNOSIS — J069 Acute upper respiratory infection, unspecified: Secondary | ICD-10-CM | POA: Diagnosis present

## 2023-04-16 LAB — POCT RAPID STREP A (OFFICE): Rapid Strep A Screen: NEGATIVE

## 2023-04-16 MED ORDER — ONDANSETRON 4 MG PO TBDP: 4.0000 mg | ORAL_TABLET | Freq: Three times a day (TID) | ORAL | 0 refills | Status: DC | PRN

## 2023-04-16 MED ORDER — IBUPROFEN 600 MG PO TABS
600.0000 mg | ORAL_TABLET | Freq: Once | ORAL | Status: AC
  Administered 2023-04-16: 600 mg via ORAL

## 2023-04-16 MED ORDER — CYCLOBENZAPRINE HCL 5 MG PO TABS: 5.0000 mg | ORAL_TABLET | Freq: Three times a day (TID) | ORAL | 0 refills | Status: DC | PRN

## 2023-04-16 NOTE — Discharge Instructions (Addendum)
Your symptoms today are most likely being caused by a virus and should steadily improve in time it can take up to 7 to 10 days before you truly start to see a turnaround however things will get better  Covid Test is pending up to 24 hours, you will be notified of positive test results only, if positive you will need to quarantine if experiencing fever, if no fever you may continue activity wearing mask until symptoms resolve  Strep testing is negative for bacteria to the throat    You can take Tylenol and/or Ibuprofen as needed for fever reduction and pain relief.  Consistently to keep fevers low  May use muscle relaxant every 8 hours as needed to help with bodyaches, may make you feel sleepy  You may use Zofran every 8 hours as needed to help with nausea, wait least 30 minutes to eat or drink after use, increase your fluid intake until you are able to eat food as normal   For cough: honey 1/2 to 1 teaspoon (you can dilute the honey in water or another fluid).  You can also use guaifenesin and dextromethorphan for cough. You can use a humidifier for chest congestion and cough.  If you don't have a humidifier, you can sit in the bathroom with the hot shower running.      For sore throat: try warm salt water gargles, cepacol lozenges, throat spray, warm tea or water with lemon/honey, popsicles or ice, or OTC cold relief medicine for throat discomfort.   For congestion: take a daily anti-histamine like Zyrtec, Claritin, and a oral decongestant, such as pseudoephedrine.  You can also use Flonase 1-2 sprays in each nostril daily.   It is important to stay hydrated: drink plenty of fluids (water, gatorade/powerade/pedialyte, juices, or teas) to keep your throat moisturized and help further relieve irritation/discomfort.

## 2023-04-16 NOTE — ED Triage Notes (Signed)
Fever 102.6, runny nose, sore throat, headache, body aches, chills, nausea that started yesterday. Taking tylenol last dose at 7 am today.

## 2023-04-16 NOTE — ED Provider Notes (Signed)
Renaldo Fiddler    CSN: [id number] Arrival date & time: 04/16/23  1331      History   Chief Complaint Chief Complaint  Patient presents with   Fever    HPI Lauren Torres is a 22 y.o. female.   Patient presents for evaluation of a sore throat and rhinorrhea beginning 1 day ago, around yesterday evening around 7 PM began to experience fever chills body aches.  Associated ear fullness bilaterally.  Mild nonproductive cough present denies shortness of breath or wheezing.  Symptoms greatly worsening in severity overnight.  Fever peaking at 102.6.  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  Decreased appetite but tolerating fluids.  Has attempted use of Tylenol.  Denies [place] history, non-smoker.  History reviewed. No pertinent past medical history.  There are no problems to display for this patient.   Past Surgical History:  Procedure Laterality Date   WISDOM TOOTH EXTRACTION      OB History   No obstetric history on file.      Home Medications    Prior to Admission medications   Medication Sig Start Date End Date Taking? Authorizing Provider  cyclobenzaprine (FLEXERIL) 5 MG tablet Take 1 tablet (5 mg total) by mouth 3 (three) times daily as needed for muscle spasms. 04/16/23  Yes Donnavin Vandenbrink R, NP  ondansetron (ZOFRAN-ODT) 4 MG disintegrating tablet Take 1 tablet (4 mg total) by mouth every 8 (eight) hours as needed for nausea or vomiting. 04/16/23  Yes Dynasty Holquin R, NP  dicyclomine (BENTYL) 10 MG capsule Take 1 capsule (10 mg total) by mouth 4 (four) times daily. 03/10/17 03/04/20  Rebecka Apley, MD    Family History Family History  Problem Relation Age of Onset   Healthy Mother    Healthy Father    Diabetes Maternal Grandmother    Diabetes Maternal Grandfather    Diabetes Paternal Grandmother    Diabetes Paternal Grandfather     Social History Social History   Tobacco Use   Smoking status: Never   Smokeless  tobacco: Never  Vaping Use   Vaping status: Never Used  Substance Use Topics   Alcohol use: No   Drug use: No     Allergies   Patient has no known allergies.   Review of Systems Review of Systems  Constitutional:  Positive for fever.     Physical Exam Triage Vital Signs ED Triage Vitals  Encounter Vitals Group     BP 04/16/23 1405 114/80     Systolic BP Percentile --      Diastolic BP Percentile --      Pulse Rate 04/16/23 1405 (!) 121     Resp 04/16/23 1405 18     Temp 04/16/23 1405 (!) 102.6 F (39.2 C)     Temp Source 04/16/23 1405 Oral     SpO2 04/16/23 1405 96 %     Weight --      Height --      Head Circumference --      Peak Flow --      Pain Score 04/16/23 1406 8     Pain Loc --      Pain Education --      Exclude from Growth Chart --    No data found.  Updated Vital Signs BP 114/80 (BP Location: Left Arm)   Pulse (!) 121   Temp (!) 102.6 F (39.2 C) (Oral)   Resp 18   LMP 01/30/2023 (Approximate)  Comment: irregular periods  SpO2 96%   Visual Acuity Right Eye Distance:   Left Eye Distance:   Bilateral Distance:    Right Eye Near:   Left Eye Near:    Bilateral Near:     Physical Exam Constitutional:      Appearance: She is ill-appearing.  HENT:     Head: Normocephalic.     Right Ear: Tympanic membrane, ear canal and external ear normal.     Left Ear: Tympanic membrane, ear canal and external ear normal.     Nose: Congestion and rhinorrhea present.     Mouth/Throat:     Mouth: Mucous membranes are moist.     Pharynx: No posterior oropharyngeal erythema.     Tonsils: No tonsillar exudate. 3+ on the right. 3+ on the left.  Cardiovascular:     Rate and Rhythm: Normal rate and regular rhythm.     Pulses: Normal pulses.     Heart sounds: Normal heart sounds.  Pulmonary:     Effort: Pulmonary effort is normal.     Breath sounds: Normal breath sounds.  Musculoskeletal:        General: Normal range of motion.     Cervical back: Normal  range of motion and neck supple.  Skin:    General: Skin is warm and dry.  Neurological:     Mental Status: She is alert and oriented to person, place, and time. Mental status is at baseline.      UC Treatments / Results  Labs (all labs ordered are listed, but only abnormal results are displayed) Labs Reviewed  SARS CORONAVIRUS 2 (TAT 6-24 HRS)  POCT RAPID STREP A (OFFICE)    EKG   Radiology No results found.  Procedures Procedures (including critical care time)  Medications Ordered in UC Medications  ibuprofen (ADVIL) tablet 600 mg (600 mg Oral Given 04/16/23 1417)    Initial Impression / Assessment and Plan / UC Course  I have reviewed the triage vital signs and the nursing notes.  Pertinent labs & imaging results that were available during my care of the patient were reviewed by me and considered in my medical decision making (see chart for details).  Viral URI   Patient is in no signs of distress nor toxic appearing.  Vital signs are stable.  Low suspicion for pneumonia, pneumothorax or bronchitis and therefore will defer imaging.  Strep testing negative.COVID test is pending, reviewed quarantine guidelines per CDC recommendations, healthy adult without comorbidity does not qualify for antivirals, prescribed muscle relaxants and Zofran. May use additional over-the-counter medications as needed for supportive care.  May follow-up with urgent care as needed if symptoms persist or worsen.  Final Clinical Impressions(s) / UC Diagnoses   Final diagnoses:  Viral URI     Discharge Instructions      Your symptoms today are most likely being caused by a virus and should steadily improve in time it can take up to 7 to 10 days before you truly start to see a turnaround however things will get better  Covid Test is pending up to 24 hours, you will be notified of positive test results only, if positive you will need to quarantine if experiencing fever, if no fever you may  continue activity wearing mask until symptoms resolve  Strep testing is negative for bacteria to the throat    You can take Tylenol and/or Ibuprofen as needed for fever reduction and pain relief.  Consistently to keep fevers low  May use  muscle relaxant every 8 hours as needed to help with bodyaches, may make you feel sleepy  You may use Zofran every 8 hours as needed to help with nausea, wait least 30 minutes to eat or drink after use, increase your fluid intake until you are able to eat food as normal   For cough: honey 1/2 to 1 teaspoon (you can dilute the honey in water or another fluid).  You can also use guaifenesin and dextromethorphan for cough. You can use a humidifier for chest congestion and cough.  If you don't have a humidifier, you can sit in the bathroom with the hot shower running.      For sore throat: try warm salt water gargles, cepacol lozenges, throat spray, warm tea or water with lemon/honey, popsicles or ice, or OTC cold relief medicine for throat discomfort.   For congestion: take a daily anti-histamine like Zyrtec, Claritin, and a oral decongestant, such as pseudoephedrine.  You can also use Flonase 1-2 sprays in each nostril daily.   It is important to stay hydrated: drink plenty of fluids (water, gatorade/powerade/pedialyte, juices, or teas) to keep your throat moisturized and help further relieve irritation/discomfort.    ED Prescriptions     Medication Sig Dispense Auth. Provider   cyclobenzaprine (FLEXERIL) 5 MG tablet Take 1 tablet (5 mg total) by mouth 3 (three) times daily as needed for muscle spasms. 30 tablet Melvie Paglia R, NP   ondansetron (ZOFRAN-ODT) 4 MG disintegrating tablet Take 1 tablet (4 mg total) by mouth every 8 (eight) hours as needed for nausea or vomiting. 20 tablet Valinda Hoar, NP      PDMP not reviewed this encounter.   Valinda Hoar, NP 04/16/23 [phone]

## 2023-04-17 LAB — SARS CORONAVIRUS 2 (TAT 6-24 HRS): SARS Coronavirus 2: POSITIVE — AB

## 2023-05-29 DIAGNOSIS — H524 Presbyopia: Secondary | ICD-10-CM | POA: Diagnosis not present

## 2023-06-02 ENCOUNTER — Encounter: Payer: Self-pay | Admitting: Emergency Medicine

## 2023-06-02 ENCOUNTER — Ambulatory Visit
Admission: EM | Admit: 2023-06-02 | Discharge: 2023-06-02 | Disposition: A | Discharge status: Home / Self Care | Payer: Medicaid Other

## 2023-06-02 DIAGNOSIS — M67431 Ganglion, right wrist: Secondary | ICD-10-CM

## 2023-06-02 NOTE — Discharge Instructions (Addendum)
Please follow-up with [facility] regarding the ganglion cyst in her right wrist for further treatment options and interventions.

## 2023-06-02 NOTE — ED Provider Notes (Signed)
[facility]    CSN: [id number] Arrival date & time: 06/02/23  1525      History   Chief Complaint Chief Complaint  Patient presents with   Cyst    Right wrist/hand    HPI Lauren Torres is a 22 y.o. female.   HPI  22 year old female with no significant past medical history presents for evaluation of a tender, painful, swollen area on the volar aspect of her right wrist that has been there for a while but is gotten worse over the last 2 months.  She currently works as a TEFL teacher and reports that she is using her hands a lot and it starting to get bigger and she wanted it evaluated.  History reviewed. No pertinent past medical history.  There are no problems to display for this patient.   Past Surgical History:  Procedure Laterality Date   WISDOM TOOTH EXTRACTION      OB History   No obstetric history on file.      Home Medications    Prior to Admission medications   Medication Sig Start Date End Date Taking? Authorizing Provider  dicyclomine (BENTYL) 10 MG capsule Take 1 capsule (10 mg total) by mouth 4 (four) times daily. 03/10/17 03/04/20  Rebecka Apley, MD    Family History Family History  Problem Relation Age of Onset   Healthy Mother    Healthy Father    Diabetes Maternal Grandmother    Diabetes Maternal Grandfather    Diabetes Paternal Grandmother    Diabetes Paternal Grandfather     Social History Social History   Tobacco Use   Smoking status: Never   Smokeless tobacco: Never  Vaping Use   Vaping status: Never Used  Substance Use Topics   Alcohol use: No   Drug use: No     Allergies   Patient has no known allergies.   Review of Systems Review of Systems  Musculoskeletal:  Positive for arthralgias and joint swelling.     Physical Exam Triage Vital Signs ED Triage Vitals  Encounter Vitals Group     BP      Systolic BP Percentile      Diastolic BP Percentile      Pulse      Resp      Temp      Temp  src      SpO2      Weight      Height      Head Circumference      Peak Flow      Pain Score      Pain Loc      Pain Education      Exclude from Growth Chart    No data found.  Updated Vital Signs BP 122/78 (BP Location: Left Arm)   Pulse 73   Temp 98.8 F (37.1 C) (Oral)   Resp 14   Ht 5\' 3"  (1.6 m)   Wt 134 lb 14.7 oz (61.2 kg)   LMP 05/14/2023 (Approximate)   SpO2 98%   BMI 23.90 kg/m   Visual Acuity Right Eye Distance:   Left Eye Distance:   Bilateral Distance:    Right Eye Near:   Left Eye Near:    Bilateral Near:     Physical Exam Vitals and nursing note reviewed.  Constitutional:      Appearance: Normal appearance. She is not ill-appearing.  HENT:     Head: Normocephalic and atraumatic.  Musculoskeletal:  General: Swelling and tenderness present. No deformity or signs of injury. Normal range of motion.  Skin:    General: Skin is warm and dry.     Capillary Refill: Capillary refill takes less than 2 seconds.  Neurological:     General: No focal deficit present.     Mental Status: She is alert and oriented to person, place, and time.      UC Treatments / Results  Labs (all labs ordered are listed, but only abnormal results are displayed) Labs Reviewed - No data to display  EKG   Radiology No results found.  Procedures Procedures (including critical care time)  Medications Ordered in UC Medications - No data to display  Initial Impression / Assessment and Plan / UC Course  I have reviewed the triage vital signs and the nursing notes.  Pertinent labs & imaging results that were available during my care of the patient were reviewed by me and considered in my medical decision making (see chart for details).   Patient is a pleasant, nontoxic-appearing 22 year old female presenting for evaluation of a ganglion cyst that is on the lateral volar aspect of her right wrist.  It is quite firm in nature without any fluctuance.  She denies  any known previous injury to her wrist.  I advised her that she needs to follow-up with orthopedics for her ganglion cyst as these are sometimes aspirated or injected with steroids to decrease inflammation.  They do occasionally had to be excised as well.  She will follow-up with [facility].  Final Clinical Impressions(s) / UC Diagnoses   Final diagnoses:  Ganglion cyst of wrist, right     Discharge Instructions      Please follow-up with [facility] regarding the ganglion cyst in her right wrist for further treatment options and interventions.     ED Prescriptions   None    PDMP not reviewed this encounter.   Becky Augusta, NP 06/02/23 [phone]

## 2023-06-02 NOTE — ED Triage Notes (Signed)
Patient reports tender cyst on her right wrist/palm of hand for the past 2 months.  Patient states that it has gotten bigger and more painful. Patient denies fevers.

## 2023-06-05 DIAGNOSIS — S63501A Unspecified sprain of right wrist, initial encounter: Secondary | ICD-10-CM | POA: Diagnosis not present

## 2023-06-05 DIAGNOSIS — M67431 Ganglion, right wrist: Secondary | ICD-10-CM | POA: Diagnosis not present

## 2023-06-20 ENCOUNTER — Telehealth: Payer: Self-pay | Admitting: Family Medicine

## 2023-06-21 DIAGNOSIS — Z111 Encounter for screening for respiratory tuberculosis: Secondary | ICD-10-CM | POA: Diagnosis not present

## 2023-06-21 DIAGNOSIS — Z23 Encounter for immunization: Secondary | ICD-10-CM | POA: Diagnosis not present

## 2023-06-22 ENCOUNTER — Telehealth: Payer: Self-pay | Admitting: Family Medicine

## 2023-06-22 NOTE — Telephone Encounter (Signed)
Patient dropped off document  [facility] health Sciences Physical Examination , to be filled out by provider. Patient requested to send it back via Call Patient to pick up within 5-days. Document is located in providers tray at front office.Please advise at Mobile [phone] (mobile)

## 2023-06-23 DIAGNOSIS — Z23 Encounter for immunization: Secondary | ICD-10-CM | POA: Diagnosis not present

## 2023-06-23 DIAGNOSIS — Z111 Encounter for screening for respiratory tuberculosis: Secondary | ICD-10-CM | POA: Diagnosis not present

## 2023-06-23 DIAGNOSIS — Z0279 Encounter for issue of other medical certificate: Secondary | ICD-10-CM | POA: Diagnosis not present

## 2023-06-23 NOTE — Telephone Encounter (Signed)
Patient will need an appointment before form can be filled out to do hearing and vision screening. Please reach out to patient and schedule an appointment. Form placed in the incomplete bin until the appointment.

## 2023-06-23 NOTE — Telephone Encounter (Signed)
Appointment has been made

## 2023-06-27 ENCOUNTER — Ambulatory Visit: Payer: Medicaid Other

## 2023-06-30 ENCOUNTER — Other Ambulatory Visit: Payer: Medicaid Other

## 2023-07-04 NOTE — Telephone Encounter (Signed)
error 

## 2023-07-12 ENCOUNTER — Ambulatory Visit: Payer: Medicaid Other | Admitting: Family Medicine

## 2023-09-04 ENCOUNTER — Encounter: Payer: Medicaid Other | Admitting: Family Medicine

## 2023-10-17 ENCOUNTER — Ambulatory Visit
Admission: RE | Admit: 2023-10-17 | Discharge: 2023-10-17 | Disposition: A | Discharge status: Home / Self Care | Payer: Medicaid Other | Source: Ambulatory Visit | Attending: Family Medicine | Admitting: Family Medicine

## 2023-10-17 ENCOUNTER — Other Ambulatory Visit: Payer: Self-pay

## 2023-10-17 VITALS — BP 119/81 | HR 82 | Temp 98.3°F | Resp 18 | Ht 63.0 in | Wt 138.0 lb

## 2023-10-17 DIAGNOSIS — R42 Dizziness and giddiness: Secondary | ICD-10-CM

## 2023-10-17 NOTE — ED Notes (Signed)
 Patient is being discharged from the Urgent Care and sent to the Emergency Department via private vehicle . Per Marylene Land PA, patient is in need of higher level of care due to dizziness, fatigue, and "feelings of passing out." Patient is aware and verbalizes understanding of plan of care.  Vitals:   10/17/23 1200  BP: 119/81  Pulse: 82  Resp: 18  Temp: 98.3 F (36.8 C)  SpO2: 98%

## 2023-10-17 NOTE — ED Triage Notes (Signed)
 Pt presents with concerns of dizziness, fatigue, "feelings of passing out", and "becoming disoriented at times" x 2 weeks. Pt is reporting a 6/10 headache. Pt also states "I have noticed I am losing some of my hair." Denies taking OTC medications for symptoms. Pt is currently Aox4, respirations regular/unlabored/even, gait is steady on assessment.

## 2023-10-17 NOTE — Discharge Instructions (Signed)
 Go to the emergency department of your choice for further evaluation

## 2023-10-17 NOTE — ED Provider Notes (Signed)
 [facility]    CSN: [id number] Arrival date & time: 10/17/23  1134      History   Chief Complaint Chief Complaint  Patient presents with   Dizziness    Entered by patient    HPI Lauren Torres is a 23 y.o. female.   The history is provided by the patient.  Dizziness 2 weeks has been having episodes of dizziness, states she will feel little off balance things seem to spin, she gets nauseous and her heart races.  Episodes last about 45 seconds, initially were into but today has had 4 episodes this morning.  At times with the episodes she feels as if she will pass out.  She admits to 2 episodes of syncope within the last 2 months both times while exercising.  Admits to fatigue and hair loss. Denies recent illness, fever, chills, sweats, shortness of breath, abdominal pain, chest pain, menstrual irregularity.  LMP 10/14/2023, does not drink caffeine or use energy drinks.  Denies smoking, alcohol and drug use.  Denies significant past medical history.  No daily medications, no known drug allergies.  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 Denies change in weight, recent change in diet new over-the-counter medicines or supplements  History reviewed. No pertinent past medical history.  There are no active problems to display for this patient.   Past Surgical History:  Procedure Laterality Date   WISDOM TOOTH EXTRACTION      OB History   No obstetric history on file.      Home Medications    Prior to Admission medications   Medication Sig Start Date End Date Taking? Authorizing Provider  dicyclomine (BENTYL) 10 MG capsule Take 1 capsule (10 mg total) by mouth 4 (four) times daily. 03/10/17 03/04/20  Rebecka Apley, MD    Family History Family History  Problem Relation Age of Onset   Healthy Mother    Healthy Father    Diabetes Maternal Grandmother    Diabetes Maternal Grandfather    Diabetes Paternal Grandmother    Diabetes Paternal Grandfather     Social  History Social History   Tobacco Use   Smoking status: Never   Smokeless tobacco: Never  Vaping Use   Vaping status: Never Used  Substance Use Topics   Alcohol use: No   Drug use: No     Allergies   Patient has no known allergies.   Review of Systems Review of Systems  Neurological:  Positive for dizziness.     Physical Exam Triage Vital Signs ED Triage Vitals  Encounter Vitals Group     BP 10/17/23 1200 119/81     Systolic BP Percentile --      Diastolic BP Percentile --      Pulse Rate 10/17/23 1200 82     Resp 10/17/23 1200 18     Temp 10/17/23 1200 98.3 F (36.8 C)     Temp Source 10/17/23 1200 Oral     SpO2 10/17/23 1200 98 %     Weight 10/17/23 1159 138 lb (62.6 kg)     Height 10/17/23 1159 5\' 3"  (1.6 m)     Head Circumference --      Peak Flow --      Pain Score 10/17/23 1159 6     Pain Loc --      Pain Education --      Exclude from Growth Chart --    No data found.  Updated Vital Signs BP 119/81 (  BP Location: Right Arm)   Pulse 82   Temp 98.3 F (36.8 C) (Oral)   Resp 18   Ht 5\' 3"  (1.6 m)   Wt 138 lb (62.6 kg)   LMP 10/14/2023 (Exact Date)   SpO2 98%   BMI 24.45 kg/m   Visual Acuity Right Eye Distance:   Left Eye Distance:   Bilateral Distance:    Right Eye Near:   Left Eye Near:    Bilateral Near:     Physical Exam Vitals and nursing note reviewed.  Constitutional:      Appearance: Normal appearance.  HENT:     Head: Normocephalic.     Mouth/Throat:     Mouth: Mucous membranes are moist.  Eyes:     Conjunctiva/sclera: Conjunctivae normal.  Cardiovascular:     Rate and Rhythm: Normal rate.  Pulmonary:     Effort: Pulmonary effort is normal. No respiratory distress.  Musculoskeletal:        General: Normal range of motion.  Skin:    General: Skin is warm and dry.  Neurological:     Mental Status: She is alert and oriented to person, place, and time.  Psychiatric:        Mood and Affect: Mood normal.      UC  Treatments / Results  Labs (all labs ordered are listed, but only abnormal results are displayed) Labs Reviewed - No data to display  EKG   Radiology No results found.  Procedures Procedures (including critical care time)  Medications Ordered in UC Medications - No data to display  Initial Impression / Assessment and Plan / UC Course  I have reviewed the triage vital signs and the nursing notes.  Pertinent labs & imaging results that were available during my care of the patient were reviewed by me and considered in my medical decision making (see chart for details).     23 year old female reports dizziness episodes associated with heart racing and feelings as if she might pass out.  She reports 2 episodes of syncope over the last 2 months with exercise.  Recommend ED evaluation.  Patient is here with friends who will take her to the emergency department Final Clinical Impressions(s) / UC Diagnoses   Final diagnoses:  Dizziness     Discharge Instructions      Go to the emergency department of your choice for further evaluation   ED Prescriptions   None    PDMP not reviewed this encounter.   Meliton Rattan, [place] 10/17/23 1215

## 2023-10-18 DIAGNOSIS — R42 Dizziness and giddiness: Secondary | ICD-10-CM | POA: Diagnosis not present

## 2023-10-19 ENCOUNTER — Encounter: Payer: Self-pay | Admitting: Family Medicine

## 2023-10-19 ENCOUNTER — Ambulatory Visit: Payer: Medicaid Other | Admitting: Family Medicine

## 2023-10-19 VITALS — BP 103/65 | HR 76 | Temp 98.0°F | Ht 63.3 in | Wt 143.2 lb

## 2023-10-19 DIAGNOSIS — R42 Dizziness and giddiness: Secondary | ICD-10-CM | POA: Diagnosis not present

## 2023-10-19 DIAGNOSIS — R55 Syncope and collapse: Secondary | ICD-10-CM | POA: Diagnosis not present

## 2023-10-19 NOTE — Progress Notes (Signed)
 BP 103/65   Pulse 76   Temp 98 F (36.7 C) (Oral)   Ht 5' 3.3" (1.608 m)   Wt 143 lb 3.2 oz (65 kg)   LMP 10/14/2023 (Exact Date)   SpO2 97%   BMI 25.13 kg/m    Subjective:    Patient ID: Lauren Torres, female    DOB: August 24, 2001, 23 y.o.   MRN: [id number]  HPI: Lauren Torres is a 23 y.o. female  Chief Complaint  Patient presents with   ER Follow Up    Patient states she was seen at [facility] for dizziness, headaches, fatigue and hair loss for the last 2 weeks. States she is still experiencing these symptoms. Prescribed Meclizine but has not picked this up yet. States her body just feels heavy.    ER FOLLOW UP Time since discharge: 2 days Hospital/facility: [facility] Diagnosis: Lightheadedness Procedures/tests: Labs, EKG Consultants: None New medications: None Discharge instructions:  follow up here Status: stable  About 2 weeks ago she notes that she was having some hair falling out- not in a clump. She has been feeling really tired and dizzy, she feels like she's been in a brain fog.   DIZZINESS Duration: past few days Description of symptoms:  tunnel vision and spinning Duration of episode: 30-45 seconds Dizziness frequency: coming and going Provoking factors: none Aggravating factors:  none Triggered by rolling over in bed: no Triggered by bending over: yes Aggravated by head movement: no Aggravated by exertion: yes Aggravated by coughing, loud noises: no Recent head injury: no Recent or current viral symptoms: no History of vasovagal episodes: yes- 4x, 2x last month, while exercising Nausea: yes Vomiting: no Tinnitus: no Hearing loss: no Aural fullness: no Headache: yes Photophobia/phonophobia: no Unsteady gait: yes Postural instability: yes Diplopia, dysarthria, dysphagia or weakness: no Related to exertion: yes Pallor: no Diaphoresis: no Dyspnea: yes Chest pain: no   Relevant past medical, surgical, family and social history  reviewed and updated as indicated. Interim medical history since our last visit reviewed. Allergies and medications reviewed and updated.  Review of Systems  Constitutional:  Positive for activity change and fatigue. Negative for appetite change, chills, diaphoresis, fever and unexpected weight change.  HENT: Negative.    Respiratory:  Positive for shortness of breath. Negative for apnea, cough, choking, chest tightness, wheezing and stridor.   Cardiovascular:  Positive for palpitations. Negative for chest pain and leg swelling.  Gastrointestinal: Negative.   Genitourinary: Negative.   Musculoskeletal: Negative.   Neurological:  Positive for dizziness, weakness and light-headedness. Negative for tremors, seizures, syncope, facial asymmetry, speech difficulty, numbness and headaches.  Hematological: Negative.   Psychiatric/Behavioral: Negative.      Per HPI unless specifically indicated above     Objective:    BP 103/65   Pulse 76   Temp 98 F (36.7 C) (Oral)   Ht 5' 3.3" (1.608 m)   Wt 143 lb 3.2 oz (65 kg)   LMP 10/14/2023 (Exact Date)   SpO2 97%   BMI 25.13 kg/m   Wt Readings from Last 3 Encounters:  10/19/23 143 lb 3.2 oz (65 kg)  10/17/23 138 lb (62.6 kg)  06/02/23 134 lb 14.7 oz (61.2 kg)    Physical Exam Vitals and nursing note reviewed.  Constitutional:      General: She is not in acute distress.    Appearance: Normal appearance. She is ill-appearing. She is not toxic-appearing or diaphoretic.  HENT:     Head: Normocephalic and atraumatic.  Right Ear: External ear normal.     Left Ear: External ear normal.     Nose: Nose normal.     Mouth/Throat:     Mouth: Mucous membranes are moist.     Pharynx: Oropharynx is clear.  Eyes:     General: No scleral icterus.       Right eye: No discharge.        Left eye: No discharge.     Extraocular Movements: Extraocular movements intact.     Conjunctiva/sclera: Conjunctivae normal.     Pupils: Pupils are equal,  round, and reactive to light.  Cardiovascular:     Rate and Rhythm: Normal rate and regular rhythm.     Pulses: Normal pulses.     Heart sounds: Normal heart sounds. No murmur heard.    No friction rub. No gallop.  Pulmonary:     Effort: Pulmonary effort is normal. No respiratory distress.     Breath sounds: Normal breath sounds. No stridor. No wheezing, rhonchi or rales.  Chest:     Chest wall: No tenderness.  Musculoskeletal:        General: Normal range of motion.     Cervical back: Normal range of motion and neck supple.  Skin:    General: Skin is warm and dry.     Capillary Refill: Capillary refill takes less than 2 seconds.     Coloration: Skin is not jaundiced or pale.     Findings: No bruising, erythema, lesion or rash.  Neurological:     General: No focal deficit present.     Mental Status: She is alert and oriented to person, place, and time. Mental status is at baseline.  Psychiatric:        Mood and Affect: Mood normal.        Behavior: Behavior normal.        Thought Content: Thought content normal.        Judgment: Judgment normal.     Results for orders placed or performed during the hospital encounter of 04/16/23  POCT rapid strep A   Collection Time: 04/16/23  2:22 PM  Result Value Ref Range   Rapid Strep A Screen Negative Negative  SARS CORONAVIRUS 2 (TAT 6-24 HRS) Anterior Nasal Swab   Collection Time: 04/16/23  2:45 PM   Specimen: Anterior Nasal Swab  Result Value Ref Range   SARS Coronavirus 2 POSITIVE (A) NEGATIVE      Assessment & Plan:   Problem List Items Addressed This Visit       Other   Syncope - Primary   Has passed out 4x while trying to exercise in the last month. Sudden onset of dizziness and palpitations. Not orthostatic. Normal EKG at the ER. Will check labs and get her into cardiology ASAP- if they can't seen her for more than 3 weeks will order zio prior to her appointment. Continue to monitor closely.      Relevant Orders    Ambulatory referral to Cardiology   Other Visit Diagnoses       Dizziness       Will check labs and get her into cardiology. May need neuro referral. Recheck 1 month.   Relevant Orders   CBC with Differential/Platelet   Thyroid Panel With TSH   B12   VITAMIN D 25 Hydroxy (Vit-D Deficiency, Fractures)   Basic metabolic panel   Ambulatory referral to Cardiology        Follow up plan: Return in about 4 weeks (  around 11/16/2023).

## 2023-10-19 NOTE — Assessment & Plan Note (Signed)
 Has passed out 4x while trying to exercise in the last month. Sudden onset of dizziness and palpitations. Not orthostatic. Normal EKG at the ER. Will check labs and get her into cardiology ASAP- if they can't seen her for more than 3 weeks will order zio prior to her appointment. Continue to monitor closely.

## 2023-10-20 LAB — VITAMIN D 25 HYDROXY (VIT D DEFICIENCY, FRACTURES): Vit D, 25-Hydroxy: 14.6 ng/mL — ABNORMAL LOW (ref 30.0–100.0)

## 2023-10-20 LAB — THYROID PANEL WITH TSH
Free Thyroxine Index: 2.2 (ref 1.2–4.9)
T3 Uptake Ratio: 27 % (ref 24–39)
T4, Total: 8.2 ug/dL (ref 4.5–12.0)
TSH: 0.761 u[IU]/mL (ref 0.450–4.500)

## 2023-10-20 LAB — CBC WITH DIFFERENTIAL/PLATELET
Basophils Absolute: 0 10*3/uL (ref 0.0–0.2)
Basos: 0 %
EOS (ABSOLUTE): 0.1 10*3/uL (ref 0.0–0.4)
Eos: 1 %
Hematocrit: 43.5 % (ref 34.0–46.6)
Hemoglobin: 14.7 g/dL (ref 11.1–15.9)
Immature Grans (Abs): 0 10*3/uL (ref 0.0–0.1)
Immature Granulocytes: 0 %
Lymphocytes Absolute: 2.4 10*3/uL (ref 0.7–3.1)
Lymphs: 28 %
MCH: 28 pg (ref 26.6–33.0)
MCHC: 33.8 g/dL (ref 31.5–35.7)
MCV: 83 fL (ref 79–97)
Monocytes Absolute: 0.4 10*3/uL (ref 0.1–0.9)
Monocytes: 5 %
Neutrophils Absolute: 5.7 10*3/uL (ref 1.4–7.0)
Neutrophils: 66 %
Platelets: 358 10*3/uL (ref 150–450)
RBC: 5.25 x10E6/uL (ref 3.77–5.28)
RDW: 13.1 % (ref 11.7–15.4)
WBC: 8.6 10*3/uL (ref 3.4–10.8)

## 2023-10-20 LAB — BASIC METABOLIC PANEL
BUN/Creatinine Ratio: 20 (ref 9–23)
BUN: 14 mg/dL (ref 6–20)
CO2: 25 mmol/L (ref 20–29)
Calcium: 9.7 mg/dL (ref 8.7–10.2)
Chloride: 98 mmol/L (ref 96–106)
Creatinine, Ser: 0.69 mg/dL (ref 0.57–1.00)
Glucose: 109 mg/dL — ABNORMAL HIGH (ref 70–99)
Potassium: 4 mmol/L (ref 3.5–5.2)
Sodium: 137 mmol/L (ref 134–144)
eGFR: 125 mL/min/{1.73_m2} (ref >59)

## 2023-10-20 LAB — VITAMIN B12: Vitamin B-12: 445 pg/mL (ref 232–1245)

## 2023-10-22 ENCOUNTER — Other Ambulatory Visit: Payer: Self-pay | Admitting: Family Medicine

## 2023-10-22 ENCOUNTER — Encounter: Payer: Self-pay | Admitting: Family Medicine

## 2023-10-22 MED ORDER — VITAMIN D (ERGOCALCIFEROL) 1.25 MG (50000 UNIT) PO CAPS: 50000.0000 [IU] | ORAL_CAPSULE | ORAL | 0 refills | Status: DC

## 2023-10-23 ENCOUNTER — Other Ambulatory Visit: Payer: Self-pay | Admitting: Family Medicine

## 2023-10-23 ENCOUNTER — Ambulatory Visit: Payer: Medicaid Other | Attending: Family Medicine

## 2023-10-23 ENCOUNTER — Encounter: Payer: Self-pay | Admitting: Family Medicine

## 2023-10-23 DIAGNOSIS — R55 Syncope and collapse: Secondary | ICD-10-CM

## 2023-10-23 DIAGNOSIS — R42 Dizziness and giddiness: Secondary | ICD-10-CM

## 2023-10-31 ENCOUNTER — Encounter: Payer: Self-pay | Admitting: Cardiovascular Disease

## 2023-10-31 ENCOUNTER — Ambulatory Visit: Payer: Medicaid Other | Attending: Cardiovascular Disease | Admitting: Cardiovascular Disease

## 2023-10-31 VITALS — BP 111/72 | HR 72 | Ht 63.3 in | Wt 143.4 lb

## 2023-10-31 DIAGNOSIS — R42 Dizziness and giddiness: Secondary | ICD-10-CM

## 2023-10-31 DIAGNOSIS — R0602 Shortness of breath: Secondary | ICD-10-CM

## 2023-10-31 DIAGNOSIS — R55 Syncope and collapse: Secondary | ICD-10-CM | POA: Diagnosis not present

## 2023-10-31 DIAGNOSIS — R079 Chest pain, unspecified: Secondary | ICD-10-CM

## 2023-10-31 NOTE — Patient Instructions (Signed)
 Medication Instructions:  No changes *If you need a refill on your cardiac medications before your next appointment, please call your pharmacy*   Lab Work: None ordered If you have labs (blood work) drawn today and your tests are completely normal, you will receive your results only by: MyChart Message (if you have MyChart) OR A paper copy in the mail If you have any lab test that is abnormal or we need to change your treatment, we will call you to review the results.   Testing/Procedures: Your physician has requested that you have an echocardiogram. Echocardiography is a painless test that uses sound waves to create images of your heart. It provides your doctor with information about the size and shape of your heart and how well your heart's chambers and valves are working.   You may receive an ultrasound enhancing agent through an IV if needed to better visualize your heart during the echo. This procedure takes approximately one hour.  There are no restrictions for this procedure.  This will take place at [address] [facility] [address] [facility] [address]) [address], [place] [address]  Please note: We ask at that you not bring children with you during ultrasound (echo/ vascular) testing. Due to room size and safety concerns, children are not allowed in the ultrasound rooms during exams. Our front office staff cannot provide observation of children in our lobby area while testing is being conducted. An adult accompanying a patient to their appointment will only be allowed in the ultrasound room at the discretion of the ultrasound technician under special circumstances. We apologize for any inconvenience.    Follow-Up: At [facility], you and your health needs are our priority.  As part of our continuing mission to provide you with exceptional heart care, we have created designated Provider Care Teams.  These Care Teams include your primary Cardiologist (physician) and Advanced Practice  Providers (APPs -  Physician Assistants and Nurse Practitioners) who all work together to provide you with the care you need, when you need it.  We recommend signing up for the patient portal called "MyChart".  Sign up information is provided on this After Visit Summary.  MyChart is used to connect with patients for Virtual Visits (Telemedicine).  Patients are able to view lab/test results, encounter notes, upcoming appointments, etc.  Non-urgent messages can be sent to your provider as well.   To learn more about what you can do with MyChart, go to [email or web address].    Your next appointment:   After testing  Provider:   You will see one of the following Advanced Practice Providers on your designated Care Team:   Nicolasa Ducking, NP Eula Listen, PA-C Cadence Fransico Michael, PA-C Charlsie Quest, NP Carlos Levering, NP  Other Instructions Your provider has ordered a exercise tolerance test. This test will evaluate the blood supply to your heart muscle during periods of exercise and rest. For this test, you will raise your heart rate by walking on a treadmill at different levels.   you may eat a light breakfast/ lunch prior to your procedure no caffeine for 24 hours prior to your test (coffee, tea, soft drinks, or chocolate)  no smoking/ vaping for 4 hours prior to your test you may take your regular medications the day of your test except for:   - nothing to hold bring any inhalers with you to your test wear comfortable clothing & tennis/ non-skid shoes to walk on the treadmill  This will take place at [address] (  [facility] [employer] [facility])  [employer] [phone]

## 2023-10-31 NOTE — Progress Notes (Signed)
 Cardiology Office Note   Date:  10/31/2023   ID:  Lauren Torres, Lauren Torres 11/08/2000, MRN [id number]  PCP:  Dorcas Carrow, DO  Cardiologist:   Lorine Bears, MD   No chief complaint on file.     History of Present Illness: Lauren Torres is a 23 y.o. female who was referred by Dr. Laural Benes for evaluation of dizziness and presyncope.  She has no prior cardiac history.  She started having fatigue and hair loss about 3 weeks ago with intermittent episodes of dizziness.  She had multiple occasions of presyncope while she was exercising on the treadmill.  During exercise, she developed a sense of palpitations with associated nausea but no vomiting.  She became extremely dizzy and lightheaded and stopped exercising and went to the locker room where she possibly had a loss of consciousness but she did not fall and thus unlikely.  She noticed increased shortness of breath with exercise and had intermittent episodes of substernal chest tightness both at rest and with exertion.  She was sent to the emergency room in February and her basic workup was unremarkable.  Thyroid function is normal.  She has not been orthostatic.  She is currently wearing a ZIO monitor. She is a lifelong non-smoker and does not drink alcohol.  She denies recent stress.  No family history of premature coronary artery disease or malignant arrhythmia.    No past medical history on file.  Past Surgical History:  Procedure Laterality Date   WISDOM TOOTH EXTRACTION       Current Outpatient Medications  Medication Sig Dispense Refill   Vitamin D, Ergocalciferol, (DRISDOL) 1.25 MG (50000 UNIT) CAPS capsule Take 1 capsule (50,000 Units total) by mouth every 7 (seven) days. 12 capsule 0   meclizine (ANTIVERT) 25 MG tablet Take 25 mg by mouth 3 (three) times daily as needed for dizziness. (Patient not taking: Reported on 10/31/2023)     No current facility-administered medications for this visit.    Allergies:    Patient has no known allergies.    Social History:  The patient  reports that she has never smoked. She has never used smokeless tobacco. She reports that she does not drink alcohol and does not use drugs.   Family History:  The patient's family history includes Diabetes in her maternal grandfather, maternal grandmother, paternal grandfather, and paternal grandmother; Healthy in her father and mother.    ROS:  Please see the history of present illness.   Otherwise, review of systems are positive for none.   All other systems are reviewed and negative.    PHYSICAL EXAM: VS:  BP 111/72   Pulse 72   Ht 5' 3.3" (1.608 m)   Wt 143 lb 6.4 oz (65 kg)   LMP 10/14/2023 (Exact Date)   SpO2 99%   BMI 25.16 kg/m  , BMI Body mass index is 25.16 kg/m. GEN: Well nourished, well developed, in no acute distress  HEENT: normal  Neck: no JVD, carotid bruits, or masses Cardiac: RRR; no murmurs, rubs, or gallops,no edema  Respiratory:  clear to auscultation bilaterally, normal work of breathing GI: soft, nontender, nondistended, + BS MS: no deformity or atrophy  Skin: warm and dry, no rash Neuro:  Strength and sensation are intact Psych: euthymic mood, full affect   EKG:  EKG is ordered today. The ekg ordered today demonstrates : Normal sinus rhythm Incomplete right bundle branch block When compared with ECG of 04-Mar-2020 14:16, No significant change was  found    Recent Labs: 03/01/2023: ALT 18 10/19/2023: BUN 14; Creatinine, Ser 0.69; Hemoglobin 14.7; Platelets 358; Potassium 4.0; Sodium 137; TSH 0.761    Lipid Panel    Component Value Date/Time   CHOL 176 03/01/2023 1133   TRIG 162 (H) 03/01/2023 1133   HDL 47 03/01/2023 1133   LDLCALC 101 (H) 03/01/2023 1133      Wt Readings from Last 3 Encounters:  10/31/23 143 lb 6.4 oz (65 kg)  10/19/23 143 lb 3.2 oz (65 kg)  10/17/23 138 lb (62.6 kg)           No data to display            ASSESSMENT AND PLAN:  1.   Presyncope during exercise: This is concerning for possible exercise-induced arrhythmia.  In addition, she describes increased shortness of breath and chest pain.  I requested a treadmill stress test and we will also obtain an echocardiogram. I agree with a ZIO monitor which is currently on. She is again not orthostatic today and her symptoms are not consistent with orthostatic dizziness anyway. Her symptoms are not consistent with vasovagal syncope.   Disposition:   FU after testing.  Signed,  Lorine Bears, MD  10/31/2023 3:10 PM    [facility]

## 2023-11-16 ENCOUNTER — Ambulatory Visit
Admission: RE | Admit: 2023-11-16 | Discharge: 2023-11-16 | Disposition: A | Discharge status: Home / Self Care | Source: Ambulatory Visit | Attending: Cardiovascular Disease | Admitting: Cardiovascular Disease

## 2023-11-16 DIAGNOSIS — R079 Chest pain, unspecified: Secondary | ICD-10-CM | POA: Insufficient documentation

## 2023-11-17 LAB — EXERCISE TOLERANCE TEST
Angina Index: 0
Duke Treadmill Score: 10
Estimated workload: 11.2
Exercise duration (min): 9 min
Exercise duration (sec): 42 s
Peak HR: 176 {beats}/min
Percent HR: 197 %
ST Depression (mm): 0 mm

## 2023-11-20 ENCOUNTER — Ambulatory Visit: Payer: Medicaid Other | Admitting: Family Medicine

## 2023-11-23 ENCOUNTER — Ambulatory Visit: Attending: Cardiovascular Disease

## 2023-11-23 DIAGNOSIS — R0602 Shortness of breath: Secondary | ICD-10-CM

## 2023-11-23 LAB — ECHOCARDIOGRAM COMPLETE
AR max vel: 2.22 cm2
AV Area VTI: 2.09 cm2
AV Area mean vel: 2.12 cm2
AV Mean grad: 3 mmHg
AV Peak grad: 4.6 mmHg
Ao pk vel: 1.07 m/s
Area-P 1/2: 3.12 cm2
Calc EF: 57.7 %
S' Lateral: 2.5 cm
Single Plane A2C EF: 59.3 %
Single Plane A4C EF: 55.5 %

## 2023-11-24 ENCOUNTER — Telehealth: Payer: Self-pay

## 2023-11-24 NOTE — Telephone Encounter (Signed)
 Forwarding to provider.   Cardiologist stated all testing so far is normal.

## 2023-11-24 NOTE — Telephone Encounter (Signed)
 Copied from CRM [id number]. Topic: General - Other >> Nov 24, 2023  1:19 PM Tiffany B wrote: Reason for CRM: Caller would like PCP to review her cardiologist most recent notes and write her a Dr. Note clearing her to work with no restrictions as soon as possible. Caller is inquiring about her heart monitor results. Caller would like a follow up call regarding her request.

## 2023-11-26 DIAGNOSIS — R42 Dizziness and giddiness: Secondary | ICD-10-CM | POA: Diagnosis not present

## 2023-11-26 DIAGNOSIS — R55 Syncope and collapse: Secondary | ICD-10-CM | POA: Diagnosis not present

## 2023-11-27 ENCOUNTER — Encounter: Payer: Self-pay | Admitting: Family Medicine

## 2023-11-27 NOTE — Telephone Encounter (Signed)
 Needs to follow up with cardiology for results.

## 2023-11-29 ENCOUNTER — Ambulatory Visit: Payer: Self-pay | Admitting: Cardiology

## 2023-11-29 NOTE — Progress Notes (Deleted)
 Cardiology Office Note:  .   Date:  11/29/2023  ID:  Lauren Torres, DOB 24-Nov-2000, MRN [id number] PCP: Dorcas Carrow, DO  [facility] Providers Cardiologist:  Lorine Bears, MD { Click to update primary MD,subspecialty MD or APP then REFRESH:1}   History of Present Illness: .   Lauren Torres is a 23 y.o. female with no significant past medical history who is referred to cardiology by her PCP for evaluation of dizziness and presyncope, who is here today for follow-up.  Previously was seen in clinic 10/31/2023 by Dr. Kirke Corin.  She had started having fatigue and hair loss about 3 weeks ago with intermittent episodes of dizziness.  She had multiple occasions of presyncope while she was exercising on the treadmill.  During exercise she developed a sense of palpitations and associated nausea without vomiting.  She became extremely dizzy and lightheaded and stopped exercising and went to the locker room where she possibly had a loss of consciousness but she did not fall.  She noticed increased shortness of breath with exercise and had intermittent episodes of substernal chest tightness both at rest and exertion.  She was evaluated in the emergency department in February and her workup was unrevealing.  She had not been orthostatic.  Her primary care provider placed her on a ZIO monitor.  She was scheduled for treadmill stress testing and an echocardiogram.  She returns to clinic today  ROS: 10 point review of system has been reviewed and considered negative with exception was been listed in the HPI  Studies Reviewed: .       2D echo 11/23/2023 1. Left ventricular ejection fraction, by estimation, is 60 to 65%. The  left ventricle has normal function. The left ventricle has no regional  wall motion abnormalities. Left ventricular diastolic parameters were  normal.   2. Right ventricular systolic function is normal. The right ventricular  size is normal. Tricuspid regurgitation  signal is inadequate for assessing  PA pressure.   3. The mitral valve is normal in structure. Mild mitral valve  regurgitation. No evidence of mitral stenosis.   4. The aortic valve is normal in structure. Aortic valve regurgitation is  not visualized. No aortic stenosis is present.   5. The inferior vena cava is normal in size with greater than 50%  respiratory variability, suggesting right atrial pressure of 3 mmHg.   Event Monitor (Zio) 11/17/2023 HR 49 - 163, average 79 bpm. Rare supraventricular and ventricular ectopy. No sustained arrhythmias. Symptom trigger episodes correspond to sinus rhythm.  Exercise treadmill study 11/16/2023  Resting EKG normal sinus rhythm rate 89 bpm blood pressure 116/70 Exercised on regular Bruce protocol Exercised for 9 minutes 42 seconds, achieved target heart rate 176 bpm, 89% of maximum predicted heart rate Achieved 11.2 METS Peak blood pressure 133/81 No significant ST or T wave changes at peak stress or in recovery concerning for ischemia No significant arrhythmia noted Heart rate down to 108 bpm at 3 minutes in recovery   Final impression Normal exercise treadmill study Good exercise tolerance   Risk Assessment/Calculations:     No BP recorded.  {Refresh Note OR Click here to enter BP  :1}***       Physical Exam:   VS:  There were no vitals taken for this visit.   Wt Readings from Last 3 Encounters:  10/31/23 143 lb 6.4 oz (65 kg)  10/19/23 143 lb 3.2 oz (65 kg)  10/17/23 138 lb (62.6 kg)  GEN: Well nourished, well developed in no acute distress NECK: No JVD; No carotid bruits CARDIAC: ***RRR, no murmurs, rubs, gallops RESPIRATORY:  Clear to auscultation without rales, wheezing or rhonchi  ABDOMEN: Soft, non-tender, non-distended EXTREMITIES:  No edema; No deformity   ASSESSMENT AND PLAN: .   Presyncope with associated lightheadedness and dizziness    {Are you ordering a CV Procedure (e.g. stress test, cath, DCCV, TEE, etc)?    Press F2        :[id number]}  Dispo: ***  Signed, Jimmie Rueter, NP

## 2023-12-08 ENCOUNTER — Ambulatory Visit: Payer: Self-pay | Admitting: Cardiology

## 2023-12-25 ENCOUNTER — Ambulatory Visit: Admitting: Family Medicine

## 2023-12-26 ENCOUNTER — Ambulatory Visit
Admission: RE | Admit: 2023-12-26 | Discharge: 2023-12-26 | Disposition: A | Discharge status: Home / Self Care | Payer: Self-pay | Source: Ambulatory Visit

## 2023-12-26 VITALS — BP 118/74 | HR 84 | Temp 97.8°F | Resp 18

## 2023-12-26 DIAGNOSIS — N760 Acute vaginitis: Secondary | ICD-10-CM | POA: Insufficient documentation

## 2023-12-26 LAB — POCT URINALYSIS DIP (MANUAL ENTRY)
Bilirubin, UA: NEGATIVE
Glucose, UA: NEGATIVE mg/dL
Ketones, POC UA: NEGATIVE mg/dL
Leukocytes, UA: NEGATIVE
Nitrite, UA: NEGATIVE
Protein Ur, POC: NEGATIVE mg/dL
Spec Grav, UA: 1.025 (ref 1.010–1.025)
Urobilinogen, UA: 1 U/dL
pH, UA: 6.5 (ref 5.0–8.0)

## 2023-12-26 LAB — POCT URINE PREGNANCY: Preg Test, Ur: NEGATIVE

## 2023-12-26 NOTE — Discharge Instructions (Signed)
  1. Acute vaginitis (Primary) - POCT urinalysis dipstick shows trace intact blood, no sign of leukocytes or nitrite, no sign of urinary tract infection as cause of symptoms. - POCT urine pregnancy negative for hCG, negative pregnancy. - Cervicovaginal collected in UC and sent to lab for further testing results should be available in 1 to 2 days via MyChart.  If there is any abnormalities patient will be contacted with appropriate guidance provided. -Continue to monitor symptoms for any change in severity if there is any escalation of current symptoms or development of new symptoms follow-up in ER for further evaluation and management.

## 2023-12-26 NOTE — ED Triage Notes (Signed)
 Patient to Urgent Care with complaints of lower back pain/ pain at the end of urination. Denies any known fevers. Denies any vaginal discharge.   Symptoms started Thursday.

## 2023-12-26 NOTE — ED Provider Notes (Signed)
 [facility]  Note:  This document was prepared using Conservation officer, historic buildings and may include unintentional dictation errors.  MRN: [id number] DOB: [birth year]  Subjective:   Lauren Torres is a 23 y.o. female presenting for vaginal itching and irritation usually worse right after finishing urination.  Patient denies any fever, vaginal discharge, vaginal lesion, severe abdominal pain, or flank pain.  Patient reports past history of bacterial vaginosis, yeast infection, urinary tract infection, and states that symptoms are similar to previous symptoms.  She states that originally felt symptoms began on Thursday has not taken any over-the-counter medication to try to treat symptoms herself.  No current facility-administered medications for this encounter.  Current Outpatient Medications:    Vitamin D , Ergocalciferol , (DRISDOL ) 1.25 MG (50000 UNIT) CAPS capsule, Take 1 capsule (50,000 Units total) by mouth every 7 (seven) days., Disp: 12 capsule, Rfl: 0   No Known Allergies  History reviewed. No pertinent past medical history.   Past Surgical History:  Procedure Laterality Date   WISDOM TOOTH EXTRACTION      Family History  Problem Relation Age of Onset   Healthy Mother    Healthy Father    Diabetes Maternal Grandmother    Diabetes Maternal Grandfather    Diabetes Paternal Grandmother    Diabetes Paternal Grandfather     Social History   Tobacco Use   Smoking status: Never   Smokeless tobacco: Never  Vaping Use   Vaping status: Never Used  Substance Use Topics   Alcohol use: No   Drug use: No    ROS Refer to HPI for ROS details.  Objective:   Vitals: BP 118/74   Pulse 84   Temp 97.8 F (36.6 C)   Resp 18   LMP 12/17/2023   SpO2 97%   Physical Exam Vitals and nursing note reviewed.  Constitutional:      General: She is not in acute distress.    Appearance: She is well-developed. She is not ill-appearing or toxic-appearing.   HENT:     Head: Normocephalic and atraumatic.  Cardiovascular:     Rate and Rhythm: Normal rate.  Pulmonary:     Effort: Pulmonary effort is normal. No respiratory distress.  Abdominal:     General: There is no distension.     Palpations: Abdomen is soft.     Tenderness: There is no abdominal tenderness. There is no right CVA tenderness, left CVA tenderness, guarding or rebound.  Genitourinary:    Vagina: No vaginal discharge.  Skin:    General: Skin is warm and dry.  Neurological:     General: No focal deficit present.     Mental Status: She is alert and oriented to person, place, and time.  Psychiatric:        Mood and Affect: Mood normal.        Behavior: Behavior normal.     Procedures  Results for orders placed or performed during the hospital encounter of 12/26/23 (from the past 24 hours)  POCT urinalysis dipstick     Status: Abnormal   Collection Time: 12/26/23 12:55 PM  Result Value Ref Range   Color, UA yellow yellow   Clarity, UA clear clear   Glucose, UA negative negative mg/dL   Bilirubin, UA negative negative   Ketones, POC UA negative negative mg/dL   Spec Grav, UA 6.578 4.696 - 1.025   Blood, UA trace-intact (A) negative   pH, UA 6.5 5.0 - 8.0   Protein Ur, POC negative negative  mg/dL   Urobilinogen, UA 1.0 0.2 or 1.0 E.U./dL   Nitrite, UA Negative Negative   Leukocytes, UA Negative Negative  POCT urine pregnancy     Status: None   Collection Time: 12/26/23 12:55 PM  Result Value Ref Range   Preg Test, Ur Negative Negative    No results found.   Assessment and Plan :     Discharge Instructions       1. Acute vaginitis (Primary) - POCT urinalysis dipstick shows trace intact blood, no sign of leukocytes or nitrite, no sign of urinary tract infection as cause of symptoms. - POCT urine pregnancy negative for hCG, negative pregnancy. - Cervicovaginal collected in UC and sent to lab for further testing results should be available in 1 to 2 days  via MyChart.  If there is any abnormalities patient will be contacted with appropriate guidance provided. -Continue to monitor symptoms for any change in severity if there is any escalation of current symptoms or development of new symptoms follow-up in ER for further evaluation and management.      [place] Greb B Lorrin Nawrot   Natha Guin, [place], [place] 12/26/23 1309

## 2023-12-27 ENCOUNTER — Telehealth (HOSPITAL_COMMUNITY): Payer: Self-pay

## 2023-12-27 LAB — CERVICOVAGINAL ANCILLARY ONLY
Bacterial Vaginitis (gardnerella): NEGATIVE
Candida Glabrata: NEGATIVE
Candida Vaginitis: POSITIVE — AB
Chlamydia: POSITIVE — AB
Comment: NEGATIVE
Comment: NEGATIVE
Comment: NEGATIVE
Comment: NEGATIVE
Comment: NEGATIVE
Comment: NORMAL
Neisseria Gonorrhea: NEGATIVE
Trichomonas: NEGATIVE

## 2023-12-27 MED ORDER — DOXYCYCLINE HYCLATE 100 MG PO TABS: 100.0000 mg | ORAL_TABLET | Freq: Two times a day (BID) | ORAL | 0 refills | Status: AC

## 2023-12-27 MED ORDER — FLUCONAZOLE 150 MG PO TABS: 150.0000 mg | ORAL_TABLET | Freq: Once | ORAL | 0 refills | Status: AC

## 2023-12-27 NOTE — Telephone Encounter (Signed)
 Per protocol, pt requires tx with Diflucan and Doxycycline.  Attempted to reach patient x1. LVM.  Rx sent to pharmacy on file.

## 2024-01-12 ENCOUNTER — Other Ambulatory Visit: Payer: Self-pay | Admitting: Family Medicine

## 2024-01-15 NOTE — Telephone Encounter (Signed)
 Requested medications are due for refill today.  yes  Requested medications are on the active medications list.  yes  Last refill. 10/22/2023 #12 0 rf  Future visit scheduled.   yes  Notes to clinic.  Refill not delegated.    Requested Prescriptions  Pending Prescriptions Disp Refills   Vitamin D , Ergocalciferol , (DRISDOL ) 1.25 MG (50000 UNIT) CAPS capsule [Pharmacy Med Name: VITAMIN D2 50,000IU (ERGO) CAP RX] 12 capsule 0    Sig: TAKE 1 CAPSULE BY MOUTH EVERY 7 DAYS     Endocrinology:  Vitamins - Vitamin D  Supplementation 2 Failed - 01/15/2024  3:32 PM      Failed - Manual Review: Route requests for 50,000 IU strength to the provider      Failed - Vitamin D  in normal range and within 360 days    Vit D, 25-Hydroxy  Date Value Ref Range Status  10/19/2023 14.6 (L) 30.0 - 100.0 ng/mL Final    Comment:    Vitamin D  deficiency has been defined by the Institute of Medicine and an Endocrine Society practice guideline as a level of serum 25-OH vitamin D  less than 20 ng/mL (1,2). The Endocrine Society went on to further define vitamin D  insufficiency as a level between 21 and 29 ng/mL (2). 1. IOM (Institute of Medicine). 2010. Dietary reference    intakes for calcium and D. [place]: The    [employer]. 2. Holick MF, Binkley , Bischoff-Ferrari HA, et al.    Evaluation, treatment, and prevention of vitamin D     deficiency: an Endocrine Society clinical practice    guideline. JCEM. 2011 Jul; 96(7):1911-30.          Failed - Valid encounter within last 12 months    Recent Outpatient Visits           2 months ago Syncope, unspecified syncope type   [facility] [place], [place], DO       Future Appointments             In 1 month Johnson, Megan P, DO [facility]            Passed - Ca in normal range and within 360 days    Calcium  Date Value Ref Range Status  10/19/2023 9.7 8.7 - 10.2 mg/dL  Final   Calcium, Total  Date Value Ref Range Status  04/11/2014 8.9 (L) 9.0 - 10.6 mg/dL Final

## 2024-03-04 ENCOUNTER — Encounter: Payer: Self-pay | Admitting: Family Medicine

## 2024-03-05 ENCOUNTER — Ambulatory Visit: Payer: Self-pay

## 2024-03-05 NOTE — Telephone Encounter (Signed)
 FYI Only or Action Required?: FYI only for provider.  Patient was last seen in primary care on 10/19/2023 by Vicci Bouchard P, DO.  Called Nurse Triage reporting No chief complaint on file..  Symptoms began several weeks ago.  Interventions attempted: Other: ER Visit on 02-26-2024.  Symptoms are: gradually worsening.  Triage Disposition: See HCP Within 4 Hours (Or PCP Triage)  Patient/caregiver understands and will follow disposition?: Yes   Copied from CRM [id number]. Topic: Clinical - Red Word Triage >> Mar 05, 2024  8:55 AM [name] wrote: Red Word that prompted transfer to Nurse Triage: Lauren Torres to ER on 02/26/24 for dizziness, shortness of breath and shakiness.  Was told to see her pcp.  She is still having these symptoms and have gotten worse. Reason for Disposition  [1] MILD difficulty breathing (e.g., minimal/no SOB at rest, SOB with walking, pulse <100) AND [2] NEW-onset or WORSE than normal  Answer Assessment - Initial Assessment Questions 1. RESPIRATORY STATUS: Describe your breathing? (e.g., wheezing, shortness of breath, unable to speak, severe coughing)      Rapid Breathing, Feels Heart beating faster,  2. ONSET: When did this breathing problem begin?      Since February  3. PATTERN Does the difficult breathing come and go, or has it been constant since it started?      Intermittent  4. SEVERITY: How bad is your breathing? (e.g., mild, moderate, severe)    - MILD: No SOB at rest, mild SOB with walking, speaks normally in sentences, can lie down, no retractions, pulse < 100.    - MODERATE: SOB at rest, SOB with minimal exertion and prefers to sit, cannot lie down flat, speaks in phrases, mild retractions, audible wheezing, pulse 100-120.    - SEVERE: Very SOB at rest, speaks in single words, struggling to breathe, sitting hunched forward, retractions, pulse > 120      Mild, Shaky,  5. RECURRENT SYMPTOM: Have you had difficulty breathing before? If Yes, ask: When was  the last time? and What happened that time?      All of this is new  6. CARDIAC HISTORY: Do you have any history of heart disease? (e.g., heart attack, angina, bypass surgery, angioplasty)      Syncope  7. LUNG HISTORY: Do you have any history of lung disease?  (e.g., pulmonary embolus, asthma, emphysema)     None  8. CAUSE: What do you think is causing the breathing problem?      Unsure  9. OTHER SYMPTOMS: Do you have any other symptoms? (e.g., dizziness, runny nose, cough, chest pain, fever)     Intermittent Chest Pain,  10. O2 SATURATION MONITOR:  Do you use an oxygen saturation monitor (pulse oximeter) at home? If Yes, ask: What is your reading (oxygen level) today? What is your usual oxygen saturation reading? (e.g., 95%)       Unsure  11. PREGNANCY: Is there any chance you are pregnant? When was your last menstrual period?       No, LMP one month and four days ago  12. TRAVEL: Have you traveled out of the country in the last month? (e.g., travel history, exposures)       Unsure, ER visit on 02-26-2024  Patient was in the ER on 02-26-2024 and was told to follow up with her PCP.  Protocols used: Breathing Difficulty-A-AH

## 2024-03-06 ENCOUNTER — Ambulatory Visit: Payer: Self-pay | Admitting: Nurse Practitioner

## 2024-03-06 ENCOUNTER — Encounter: Payer: Medicaid Other | Admitting: Family Medicine

## 2024-03-06 ENCOUNTER — Encounter: Payer: Self-pay | Admitting: Nurse Practitioner

## 2024-03-06 VITALS — BP 113/78 | HR 80 | Temp 98.1°F | Ht 63.5 in | Wt 138.2 lb

## 2024-03-06 DIAGNOSIS — F419 Anxiety disorder, unspecified: Secondary | ICD-10-CM | POA: Insufficient documentation

## 2024-03-06 DIAGNOSIS — R079 Chest pain, unspecified: Secondary | ICD-10-CM | POA: Diagnosis not present

## 2024-03-06 DIAGNOSIS — R0602 Shortness of breath: Secondary | ICD-10-CM

## 2024-03-06 LAB — URINALYSIS, ROUTINE W REFLEX MICROSCOPIC
Bilirubin, UA: NEGATIVE
Glucose, UA: NEGATIVE
Leukocytes,UA: NEGATIVE
Nitrite, UA: NEGATIVE
Specific Gravity, UA: 1.02 (ref 1.005–1.030)
Urobilinogen, Ur: 0.2 mg/dL (ref 0.2–1.0)
pH, UA: 8.5 — ABNORMAL HIGH (ref 5.0–7.5)

## 2024-03-06 LAB — MICROSCOPIC EXAMINATION
Bacteria, UA: NONE SEEN
WBC, UA: NONE SEEN /HPF (ref 0–5)

## 2024-03-06 MED ORDER — HYDROXYZINE PAMOATE 25 MG PO CAPS: 25.0000 mg | ORAL_CAPSULE | Freq: Three times a day (TID) | ORAL | 0 refills | Status: DC | PRN

## 2024-03-06 NOTE — Progress Notes (Signed)
 BP 113/78 (BP Location: Left Arm, Patient Position: Sitting, Cuff Size: Normal)   Pulse 80   Temp 98.1 F (36.7 C) (Oral)   Ht 5' 3.5 (1.613 m)   Wt 138 lb 3.2 oz (62.7 kg)   SpO2 100%   BMI 24.10 kg/m    Subjective:    Patient ID: Lauren Torres, female    DOB: Nov 04, 2000, 23 y.o.   MRN: [id number]  HPI: Lauren Torres is a 23 y.o. female  Chief Complaint  Patient presents with   Shortness of Breath   Patient presents to clinic with complaints of chest pain and SOB.  She had chest pain starting in March.  Had cardiac workup and it was unremarkable.  States a week ago she started getting chest pain, dizziness, and felt like she was going to faint, felt like she couldn't breath.  She was seen in the ED on 02/26/24 and was unremarkable.  States the past couple of days she can't focus, talk to people, just wants to sleep, feels like she can't keep her eyes open.  Hair is still falling out.  She feels like she is drinking her about 60 ounces per day.  Doesn't drink any caffeine.  She was taking vitamin D  but she ran out of it.  She states her last period was about 5 weeks ago.  Was regular until about 3 months ago.  Then has been irregular since then.    Flowsheet Row Office Visit from 10/19/2023 in [facility]  PHQ-9 Total Score 21      10/19/2023   12:52 PM 03/01/2023   11:09 AM  GAD 7 : Generalized Anxiety Score  Nervous, Anxious, on Edge 2 0  Control/stop worrying 1 1  Worry too much - different things 1 1  Trouble relaxing 3 2  Restless 1 2  Easily annoyed or irritable 3 1  Afraid - awful might happen 1 0  Total GAD 7 Score 12 7  Anxiety Difficulty Somewhat difficult Somewhat difficult       Relevant past medical, surgical, family and social history reviewed and updated as indicated. Interim medical history since our last visit reviewed. Allergies and medications reviewed and updated.  Review of Systems  Respiratory:  Positive for  shortness of breath.   Cardiovascular:  Positive for chest pain.  Neurological:  Positive for dizziness and light-headedness.  Psychiatric/Behavioral:  Positive for dysphoric mood. The patient is nervous/anxious.     Per HPI unless specifically indicated above     Objective:    BP 113/78 (BP Location: Left Arm, Patient Position: Sitting, Cuff Size: Normal)   Pulse 80   Temp 98.1 F (36.7 C) (Oral)   Ht 5' 3.5 (1.613 m)   Wt 138 lb 3.2 oz (62.7 kg)   SpO2 100%   BMI 24.10 kg/m   Wt Readings from Last 3 Encounters:  03/06/24 138 lb 3.2 oz (62.7 kg)  10/31/23 143 lb 6.4 oz (65 kg)  10/19/23 143 lb 3.2 oz (65 kg)    Physical Exam Vitals and nursing note reviewed.  Constitutional:      General: She is not in acute distress.    Appearance: Normal appearance. She is normal weight. She is not ill-appearing, toxic-appearing or diaphoretic.  HENT:     Head: Normocephalic.     Right Ear: External ear normal.     Left Ear: External ear normal.     Nose: Nose normal.     Mouth/Throat:  Mouth: Mucous membranes are moist.     Pharynx: Oropharynx is clear.  Eyes:     General:        Right eye: No discharge.        Left eye: No discharge.     Extraocular Movements: Extraocular movements intact.     Conjunctiva/sclera: Conjunctivae normal.     Pupils: Pupils are equal, round, and reactive to light.  Cardiovascular:     Rate and Rhythm: Normal rate and regular rhythm.     Heart sounds: No murmur heard. Pulmonary:     Effort: Pulmonary effort is normal. No respiratory distress.     Breath sounds: Normal breath sounds. No wheezing or rales.  Musculoskeletal:     Cervical back: Normal range of motion and neck supple.  Skin:    General: Skin is warm and dry.     Capillary Refill: Capillary refill takes less than 2 seconds.  Neurological:     General: No focal deficit present.     Mental Status: She is alert and oriented to person, place, and time. Mental status is at baseline.   Psychiatric:        Mood and Affect: Mood normal.        Behavior: Behavior normal.        Thought Content: Thought content normal.        Judgment: Judgment normal.     Results for orders placed or performed during the hospital encounter of 12/26/23  POCT urinalysis dipstick   Collection Time: 12/26/23 12:55 PM  Result Value Ref Range   Color, UA yellow yellow   Clarity, UA clear clear   Glucose, UA negative negative mg/dL   Bilirubin, UA negative negative   Ketones, POC UA negative negative mg/dL   Spec Grav, UA 8.974 8.989 - 1.025   Blood, UA trace-intact (A) negative   pH, UA 6.5 5.0 - 8.0   Protein Ur, POC negative negative mg/dL   Urobilinogen, UA 1.0 0.2 or 1.0 E.U./dL   Nitrite, UA Negative Negative   Leukocytes, UA Negative Negative  POCT urine pregnancy   Collection Time: 12/26/23 12:55 PM  Result Value Ref Range   Preg Test, Ur Negative Negative  Cervicovaginal ancillary only   Collection Time: 12/26/23  1:06 PM  Result Value Ref Range   Neisseria Gonorrhea Negative    Chlamydia Positive (A)    Trichomonas Negative    Bacterial Vaginitis (gardnerella) Negative    Candida Vaginitis Positive (A)    Candida Glabrata Negative    Comment      Normal Reference Range Bacterial Vaginosis - Negative   Comment Normal Reference Range Candida Species - Negative    Comment Normal Reference Range Candida Galbrata - Negative    Comment Normal Reference Range Trichomonas - Negative    Comment Normal Reference Ranger Chlamydia - Negative    Comment      Normal Reference Range Neisseria Gonorrhea - Negative      Assessment & Plan:   Problem List Items Addressed This Visit       Other   Anxiety - Primary   Suspect symptoms are related to anxiety.  Cardiac workup in the past was unremarkable.  EKG and orthostatics in office are normal.  Will order labs at visit today.  Will treat based on labs.  Will follow up tomorrow to discuss labs.  If labs are normal patient agrees  to start medication for anxiety.  Letter written for work.  Relevant Medications   hydrOXYzine  (VISTARIL ) 25 MG capsule   Other Visit Diagnoses       Shortness of breath       Relevant Orders   EKG 12-Lead   Comp Met (CMET)   Thyroid  antibodies (Thyroperoxidase & Thyroglobulin)   TSH   T4, free   CBC w/Diff   B12   Vitamin D  (25 hydroxy)   Urinalysis, Routine w reflex microscopic     Chest pain, unspecified type       Relevant Orders   Comp Met (CMET)   Thyroid  antibodies (Thyroperoxidase & Thyroglobulin)   TSH   T4, free   CBC w/Diff   B12   Vitamin D  (25 hydroxy)   Urinalysis, Routine w reflex microscopic        Follow up plan: Return for 1120 virtual tomorrow.

## 2024-03-06 NOTE — Assessment & Plan Note (Signed)
 Suspect symptoms are related to anxiety.  Cardiac workup in the past was unremarkable.  EKG and orthostatics in office are normal.  Will order labs at visit today.  Will treat based on labs.  Will follow up tomorrow to discuss labs.  If labs are normal patient agrees to start medication for anxiety.  Letter written for work.

## 2024-03-07 ENCOUNTER — Ambulatory Visit: Admitting: Nurse Practitioner

## 2024-03-07 LAB — VITAMIN B12: Vitamin B-12: 526 pg/mL (ref 232–1245)

## 2024-03-07 LAB — CBC WITH DIFFERENTIAL/PLATELET
Basophils Absolute: 0 x10E3/uL (ref 0.0–0.2)
Basos: 0 %
EOS (ABSOLUTE): 0.1 x10E3/uL (ref 0.0–0.4)
Eos: 1 %
Hematocrit: 48.9 % — ABNORMAL HIGH (ref 34.0–46.6)
Hemoglobin: 15.5 g/dL (ref 11.1–15.9)
Immature Grans (Abs): 0 x10E3/uL (ref 0.0–0.1)
Immature Granulocytes: 0 %
Lymphocytes Absolute: 3.2 x10E3/uL — ABNORMAL HIGH (ref 0.7–3.1)
Lymphs: 37 %
MCH: 27.9 pg (ref 26.6–33.0)
MCHC: 31.7 g/dL (ref 31.5–35.7)
MCV: 88 fL (ref 79–97)
Monocytes Absolute: 0.5 x10E3/uL (ref 0.1–0.9)
Monocytes: 6 %
Neutrophils Absolute: 4.8 x10E3/uL (ref 1.4–7.0)
Neutrophils: 56 %
Platelets: 341 x10E3/uL (ref 150–450)
RBC: 5.56 x10E6/uL — ABNORMAL HIGH (ref 3.77–5.28)
RDW: 13.9 % (ref 11.7–15.4)
WBC: 8.5 x10E3/uL (ref 3.4–10.8)

## 2024-03-07 LAB — COMPREHENSIVE METABOLIC PANEL WITH GFR
ALT: 14 IU/L (ref 0–32)
AST: 21 IU/L (ref 0–40)
Albumin: 5 g/dL (ref 4.0–5.0)
Alkaline Phosphatase: 101 IU/L (ref 44–121)
BUN/Creatinine Ratio: 11 (ref 9–23)
BUN: 9 mg/dL (ref 6–20)
Bilirubin Total: 1.5 mg/dL — ABNORMAL HIGH (ref 0.0–1.2)
CO2: 19 mmol/L — ABNORMAL LOW (ref 20–29)
Calcium: 9.8 mg/dL (ref 8.7–10.2)
Chloride: 103 mmol/L (ref 96–106)
Creatinine, Ser: 0.79 mg/dL (ref 0.57–1.00)
Globulin, Total: 2.8 g/dL (ref 1.5–4.5)
Glucose: 99 mg/dL (ref 70–99)
Potassium: 4.1 mmol/L (ref 3.5–5.2)
Sodium: 139 mmol/L (ref 134–144)
Total Protein: 7.8 g/dL (ref 6.0–8.5)
eGFR: 108 mL/min/1.73 (ref >59)

## 2024-03-07 LAB — VITAMIN D 25 HYDROXY (VIT D DEFICIENCY, FRACTURES): Vit D, 25-Hydroxy: 28.7 ng/mL — ABNORMAL LOW (ref 30.0–100.0)

## 2024-03-07 LAB — T4, FREE: Free T4: 1.3 ng/dL (ref 0.82–1.77)

## 2024-03-07 LAB — THYROID ANTIBODIES (THYROPEROXIDASE & THYROGLOBULIN)
Thyroglobulin Antibody: <1 [IU]/mL (ref 0.0–0.9)
Thyroperoxidase Ab SerPl-aCnc: <9 [IU]/mL (ref 0–34)

## 2024-03-07 LAB — TSH: TSH: 0.984 u[IU]/mL (ref 0.450–4.500)

## 2024-03-07 NOTE — Progress Notes (Deleted)
 There were no vitals taken for this visit.   Subjective:    Patient ID: Lauren Torres, female    DOB: Dec 20, 2000, 23 y.o.   MRN: [id number]  HPI: Lauren Torres is a 23 y.o. female  No chief complaint on file.  ANXIETY   Relevant past medical, surgical, family and social history reviewed and updated as indicated. Interim medical history since our last visit reviewed. Allergies and medications reviewed and updated.  Review of Systems  Per HPI unless specifically indicated above     Objective:    There were no vitals taken for this visit.  Wt Readings from Last 3 Encounters:  03/06/24 138 lb 3.2 oz (62.7 kg)  10/31/23 143 lb 6.4 oz (65 kg)  10/19/23 143 lb 3.2 oz (65 kg)    Physical Exam  Results for orders placed or performed in visit on 03/06/24  Microscopic Examination   Collection Time: 03/06/24  9:50 AM   Urine  Result Value Ref Range   WBC, UA None seen 0 - 5 /hpf   RBC, Urine 0-2 0 - 2 /hpf   Epithelial Cells (non renal) >10E 0 - 10 /hpf   Mucus, UA Present (A) Not Estab.   Bacteria, UA None seen None seen/Few  Urinalysis, Routine w reflex microscopic   Collection Time: 03/06/24  9:50 AM  Result Value Ref Range   Specific Gravity, UA 1.020 1.005 - 1.030   pH, UA 8.5 (H) 5.0 - 7.5   Color, UA Yellow Yellow   Appearance Ur Cloudy (A) Clear   Leukocytes,UA Negative Negative   Protein,UA Trace (A) Negative/Trace   Glucose, UA Negative Negative   Ketones, UA 2+ (A) Negative   RBC, UA Trace (A) Negative   Bilirubin, UA Negative Negative   Urobilinogen, Ur 0.2 0.2 - 1.0 mg/dL   Nitrite, UA Negative Negative   Microscopic Examination See below:   Comp Met (CMET)   Collection Time: 03/06/24  9:53 AM  Result Value Ref Range   Glucose 99 70 - 99 mg/dL   BUN 9 6 - 20 mg/dL   Creatinine, Ser 9.20 0.57 - 1.00 mg/dL   eGFR 891 >40 fO/fpw/8.26   BUN/Creatinine Ratio 11 9 - 23   Sodium 139 134 - 144 mmol/L   Potassium 4.1 3.5 - 5.2 mmol/L    Chloride 103 96 - 106 mmol/L   CO2 19 (L) 20 - 29 mmol/L   Calcium 9.8 8.7 - 10.2 mg/dL   Total Protein 7.8 6.0 - 8.5 g/dL   Albumin 5.0 4.0 - 5.0 g/dL   Globulin, Total 2.8 1.5 - 4.5 g/dL   Bilirubin Total 1.5 (H) 0.0 - 1.2 mg/dL   Alkaline Phosphatase 101 44 - 121 IU/L   AST 21 0 - 40 IU/L   ALT 14 0 - 32 IU/L  Thyroid  antibodies (Thyroperoxidase & Thyroglobulin)   Collection Time: 03/06/24  9:53 AM  Result Value Ref Range   Thyroperoxidase Ab SerPl-aCnc <9 0 - 34 IU/mL   Thyroglobulin Antibody <1.0 0.0 - 0.9 IU/mL  TSH   Collection Time: 03/06/24  9:53 AM  Result Value Ref Range   TSH 0.984 0.450 - 4.500 uIU/mL  T4, free   Collection Time: 03/06/24  9:53 AM  Result Value Ref Range   Free T4 1.30 0.82 - 1.77 ng/dL  CBC w/Diff   Collection Time: 03/06/24  9:53 AM  Result Value Ref Range   WBC 8.5 3.4 - 10.8 x10E3/uL   RBC 5.56 (  H) 3.77 - 5.28 x10E6/uL   Hemoglobin 15.5 11.1 - 15.9 g/dL   Hematocrit 51.0 (H) 65.9 - 46.6 %   MCV 88 79 - 97 fL   MCH 27.9 26.6 - 33.0 pg   MCHC 31.7 31.5 - 35.7 g/dL   RDW 86.0 88.2 - 84.5 %   Platelets 341 150 - 450 x10E3/uL   Neutrophils 56 Not Estab. %   Lymphs 37 Not Estab. %   Monocytes 6 Not Estab. %   Eos 1 Not Estab. %   Basos 0 Not Estab. %   Neutrophils Absolute 4.8 1.4 - 7.0 x10E3/uL   Lymphocytes Absolute 3.2 (H) 0.7 - 3.1 x10E3/uL   Monocytes Absolute 0.5 0.1 - 0.9 x10E3/uL   EOS (ABSOLUTE) 0.1 0.0 - 0.4 x10E3/uL   Basophils Absolute 0.0 0.0 - 0.2 x10E3/uL   Immature Granulocytes 0 Not Estab. %   Immature Grans (Abs) 0.0 0.0 - 0.1 x10E3/uL  B12   Collection Time: 03/06/24  9:53 AM  Result Value Ref Range   Vitamin B-12 526 232 - 1,245 pg/mL  Vitamin D  (25 hydroxy)   Collection Time: 03/06/24  9:53 AM  Result Value Ref Range   Vit D, 25-Hydroxy 28.7 (L) 30.0 - 100.0 ng/mL      Assessment & Plan:   Problem List Items Addressed This Visit   None    Follow up plan: No follow-ups on file.

## 2024-03-12 ENCOUNTER — Inpatient Hospital Stay: Payer: Self-pay | Admitting: Nurse Practitioner

## 2024-03-19 ENCOUNTER — Encounter: Payer: Self-pay | Admitting: Family Medicine

## 2024-03-19 ENCOUNTER — Ambulatory Visit (INDEPENDENT_AMBULATORY_CARE_PROVIDER_SITE_OTHER): Admitting: Family Medicine

## 2024-03-19 VITALS — BP 93/62 | HR 92 | Temp 98.0°F | Ht 63.5 in | Wt 139.0 lb

## 2024-03-19 DIAGNOSIS — F419 Anxiety disorder, unspecified: Secondary | ICD-10-CM | POA: Diagnosis not present

## 2024-03-19 DIAGNOSIS — R0683 Snoring: Secondary | ICD-10-CM

## 2024-03-19 DIAGNOSIS — Z0000 Encounter for general adult medical examination without abnormal findings: Secondary | ICD-10-CM

## 2024-03-19 DIAGNOSIS — R339 Retention of urine, unspecified: Secondary | ICD-10-CM | POA: Diagnosis not present

## 2024-03-19 DIAGNOSIS — J351 Hypertrophy of tonsils: Secondary | ICD-10-CM | POA: Diagnosis not present

## 2024-03-19 DIAGNOSIS — N926 Irregular menstruation, unspecified: Secondary | ICD-10-CM

## 2024-03-19 LAB — URINALYSIS, ROUTINE W REFLEX MICROSCOPIC
Bilirubin, UA: NEGATIVE
Glucose, UA: NEGATIVE
Ketones, UA: NEGATIVE
Leukocytes,UA: NEGATIVE
Nitrite, UA: NEGATIVE
Specific Gravity, UA: 1.02 (ref 1.005–1.030)
Urobilinogen, Ur: 0.2 mg/dL (ref 0.2–1.0)
pH, UA: 7.5 (ref 5.0–7.5)

## 2024-03-19 LAB — MICROSCOPIC EXAMINATION
Bacteria, UA: NONE SEEN
WBC, UA: NONE SEEN /HPF (ref 0–5)

## 2024-03-19 LAB — WET PREP FOR TRICH, YEAST, CLUE
Clue Cell Exam: NEGATIVE
Trichomonas Exam: NEGATIVE
Yeast Exam: NEGATIVE

## 2024-03-19 LAB — BAYER DCA HB A1C WAIVED: HB A1C (BAYER DCA - WAIVED): 5.4 % (ref 4.8–5.6)

## 2024-03-19 MED ORDER — KETOCONAZOLE 2 % EX SHAM: 1.0000 | MEDICATED_SHAMPOO | CUTANEOUS | 0 refills | Status: AC

## 2024-03-19 MED ORDER — NORETHIN ACE-ETH ESTRAD-FE 1-20 MG-MCG PO TABS: 1.0000 | ORAL_TABLET | Freq: Every day | ORAL | 3 refills | Status: DC

## 2024-03-19 MED ORDER — BUSPIRONE HCL 5 MG PO TABS: 5.0000 mg | ORAL_TABLET | Freq: Two times a day (BID) | ORAL | 3 refills | Status: DC

## 2024-03-19 NOTE — Progress Notes (Signed)
 BP 93/62   Pulse 92   Temp 98 F (36.7 C) (Oral)   Ht 5' 3.5 (1.613 m)   Wt 139 lb (63 kg)   LMP 03/18/2024 (Exact Date)   SpO2 98%   BMI 24.24 kg/m    Subjective:    Patient ID: Lauren Torres, female    DOB: 2000-12-25, 23 y.o.   MRN: [id number]  HPI: Lauren Torres is a 23 y.o. female presenting on 03/19/2024 for comprehensive medical examination. Current medical complaints include:  ANXIETY/STRESS- did not do well with the hydroxyzine , it made her worse and she felt more anxious Duration: chronic Status:uncontrolled Anxious mood: yes  Excessive worrying: yes Irritability: no  Sweating: yes Nausea: yes Palpitations:yes Hyperventilation: no Panic attacks: yes Agoraphobia: no  Obscessions/compulsions: no Depressed mood: yes    03/19/2024    8:32 AM 10/19/2023   12:53 PM 03/01/2023   11:08 AM  Depression screen PHQ 2/9  Decreased Interest 2 3 1   Down, Depressed, Hopeless 1 1 2   PHQ - 2 Score 3 4 3   Altered sleeping 3 3 3   Tired, decreased energy 1 3 2   Change in appetite 1 3 1   Feeling bad or failure about yourself  0 1 0  Trouble concentrating 0 3 0  Moving slowly or fidgety/restless 2 3 0  Suicidal thoughts 0 1 0  PHQ-9 Score 10 21 9   Difficult doing work/chores Somewhat difficult Extremely dIfficult Somewhat difficult   Anhedonia: no Weight changes: no Insomnia: yes hard to stay asleep  Hypersomnia: yes Fatigue/loss of energy: yes Feelings of worthlessness: no Feelings of guilt: no Impaired concentration/indecisiveness: no Suicidal ideations: no  Crying spells: no Recent Stressors/Life Changes: yes   Relationship problems: no   Family stress: no     Financial stress: no    Job stress: no    Recent death/loss: no  ABNORMAL MENSTRUAL PERIODS Duration: months Average interval between menses: sometimes back to back, sometimes months apart Length of menses: 2 days to 16 days Flow: all over the place Dysmenorrhea: yes Intermenstrual  bleeding:yes Postcoital bleeding: yes Contraception: condoms Menarche at age:  Sexual activity: Practices careful partner selection, always uses condoms History of sexually transmitted diseases: no History GYN procedures: no Abnormal pap smears: no   Dyspareunia: yes Vaginal discharge:yes Abdominal pain: yes Galactorrhea: no Hirsuitism: no Frequent bruising/mucosal bleeding: yes Double vision:no Hot flashes: yes  ???SLEEP APNEA Sleep apnea status: unknown Duration: chronic Satisfied with current treatment?:  no CPAP use:  No Last sleep study: never Treatments attempted: none Wakes feeling refreshed:  no Daytime hypersomnolence:  yes Fatigue:  yes Insomnia:  no Good sleep hygiene:  yes Difficulty falling asleep:  no Difficulty staying asleep:  yes Snoring bothers bed partner:  yes Observed apnea by bed partner: yes Obesity:  no Hypertension: no  Pulmonary hypertension:  no Coronary artery disease:  no    Menopausal Symptoms: no  Depression Screen done today and results listed below:     03/19/2024    8:32 AM 10/19/2023   12:53 PM 03/01/2023   11:08 AM  Depression screen PHQ 2/9  Decreased Interest 2 3 1   Down, Depressed, Hopeless 1 1 2   PHQ - 2 Score 3 4 3   Altered sleeping 3 3 3   Tired, decreased energy 1 3 2   Change in appetite 1 3 1   Feeling bad or failure about yourself  0 1 0  Trouble concentrating 0 3 0  Moving slowly or fidgety/restless 2 3 0  Suicidal thoughts 0 1 0  PHQ-9 Score 10 21 9   Difficult doing work/chores Somewhat difficult Extremely dIfficult Somewhat difficult    Past Medical History:  Past Medical History:  Diagnosis Date   Syncope and collapse     Surgical History:  Past Surgical History:  Procedure Laterality Date   WISDOM TOOTH EXTRACTION      Medications:  Current Outpatient Medications on File Prior to Visit  Medication Sig   [DISCONTINUED] dicyclomine  (BENTYL ) 10 MG capsule Take 1 capsule (10 mg total) by mouth 4  (four) times daily.   No current facility-administered medications on file prior to visit.    Allergies:  No Known Allergies  Social History:  Social History   Socioeconomic History   Marital status: Single    Spouse name: Not on file   Number of children: Not on file   Years of education: Not on file   Highest education level: Not on file  Occupational History   Not on file  Tobacco Use   Smoking status: Never   Smokeless tobacco: Never  Vaping Use   Vaping status: Never Used  Substance and Sexual Activity   Alcohol use: No   Drug use: No   Sexual activity: Not Currently  Other Topics Concern   Not on file  Social History Narrative   Not on file   Social Drivers of Health   Financial Resource Strain: Low Risk  (03/19/2024)   Overall Financial Resource Strain (CARDIA)    Difficulty of Paying Living Expenses: Not very hard  Food Insecurity: No Food Insecurity (03/19/2024)   Hunger Vital Sign    Worried About Running Out of Food in the Last Year: Never true    Ran Out of Food in the Last Year: Never true  Transportation Needs: No Transportation Needs (03/19/2024)   PRAPARE - Administrator, Civil Service (Medical): No    Lack of Transportation (Non-Medical): No  Physical Activity: Sufficiently Active (03/19/2024)   Exercise Vital Sign    Days of Exercise per Week: 5 days    Minutes of Exercise per Session: 120 min  Stress: Stress Concern Present (03/19/2024)   [employer] of Occupational Health - Occupational Stress Questionnaire    Feeling of Stress: Rather much  Social Connections: Moderately Integrated (03/19/2024)   Social Connection and Isolation Panel    Frequency of Communication with Friends and Family: Three times a week    Frequency of Social Gatherings with Friends and Family: Once a week    Attends Religious Services: More than 4 times per year    Active Member of [employer] or Organizations: Yes    Attends Engineer, structural: More  than 4 times per year    Marital Status: Never married  Intimate Partner Violence: Not At Risk (03/19/2024)   Humiliation, Afraid, Rape, and Kick questionnaire    Fear of Current or Ex-Partner: No    Emotionally Abused: No    Physically Abused: No    Sexually Abused: No   Social History   Tobacco Use  Smoking Status Never  Smokeless Tobacco Never   Social History   Substance and Sexual Activity  Alcohol Use No    Family History:  Family History  Problem Relation Age of Onset   Healthy Mother    Healthy Father    Diabetes Maternal Grandmother    Diabetes Maternal Grandfather    Diabetes Paternal Grandmother    Diabetes Paternal Grandfather     Past medical  history, surgical history, medications, allergies, family history and social history reviewed with patient today and changes made to appropriate areas of the chart.   Review of Systems  Constitutional:  Positive for chills and diaphoresis. Negative for fever, malaise/fatigue and weight loss.  HENT:  Positive for congestion. Negative for ear discharge, ear pain, hearing loss, nosebleeds, sinus pain, sore throat and tinnitus.   Eyes:  Positive for blurred vision. Negative for double vision, photophobia, pain, discharge and redness.  Respiratory:  Positive for shortness of breath. Negative for cough, hemoptysis, sputum production, wheezing and stridor.   Cardiovascular:  Positive for palpitations. Negative for chest pain, orthopnea, claudication, leg swelling and PND.  Gastrointestinal:  Positive for heartburn (after eating). Negative for abdominal pain, blood in stool, constipation, diarrhea, melena, nausea and vomiting.  Genitourinary:  Positive for dysuria. Negative for flank pain, frequency, hematuria and urgency.       Bumps in her groin, + discharge with a sour smell   Musculoskeletal:  Positive for back pain. Negative for falls, joint pain, myalgias and neck pain.  Skin:  Positive for rash. Negative for itching.   Neurological:  Positive for tingling. Negative for dizziness, tremors, sensory change, speech change, focal weakness, seizures, loss of consciousness, weakness and headaches.  Endo/Heme/Allergies:  Positive for polydipsia. Negative for environmental allergies. Bruises/bleeds easily.  Psychiatric/Behavioral:  Negative for depression, hallucinations, memory loss, substance abuse and suicidal ideas. The patient is nervous/anxious. The patient does not have insomnia.    All other ROS negative except what is listed above and in the HPI.      Objective:    BP 93/62   Pulse 92   Temp 98 F (36.7 C) (Oral)   Ht 5' 3.5 (1.613 m)   Wt 139 lb (63 kg)   LMP 03/18/2024 (Exact Date)   SpO2 98%   BMI 24.24 kg/m   Wt Readings from Last 3 Encounters:  03/19/24 139 lb (63 kg)  03/06/24 138 lb 3.2 oz (62.7 kg)  10/31/23 143 lb 6.4 oz (65 kg)    Physical Exam Vitals and nursing note reviewed.  Constitutional:      General: She is not in acute distress.    Appearance: Normal appearance. She is not ill-appearing, toxic-appearing or diaphoretic.  HENT:     Head: Normocephalic and atraumatic.     Right Ear: Tympanic membrane, ear canal and external ear normal. There is no impacted cerumen.     Left Ear: Tympanic membrane, ear canal and external ear normal. There is no impacted cerumen.     Nose: Nose normal. No congestion or rhinorrhea.     Mouth/Throat:     Mouth: Mucous membranes are moist.     Pharynx: Oropharynx is clear. No oropharyngeal exudate or posterior oropharyngeal erythema.     Tonsils: 4+ on the right. 4+ on the left.     Comments: Tonsils nearly touching Eyes:     General: No scleral icterus.       Right eye: No discharge.        Left eye: No discharge.     Extraocular Movements: Extraocular movements intact.     Conjunctiva/sclera: Conjunctivae normal.     Pupils: Pupils are equal, round, and reactive to light.  Neck:     Vascular: No carotid bruit.  Cardiovascular:      Rate and Rhythm: Normal rate and regular rhythm.     Pulses: Normal pulses.     Heart sounds: No murmur heard.    No  friction rub. No gallop.  Pulmonary:     Effort: Pulmonary effort is normal. No respiratory distress.     Breath sounds: Normal breath sounds. No stridor. No wheezing, rhonchi or rales.  Chest:     Chest wall: No tenderness.  Abdominal:     General: Abdomen is flat. Bowel sounds are normal. There is no distension.     Palpations: Abdomen is soft. There is no mass.     Tenderness: There is no abdominal tenderness. There is no right CVA tenderness, left CVA tenderness, guarding or rebound.     Hernia: No hernia is present.  Genitourinary:    Comments: Breast and pelvic exams deferred with shared decision making Musculoskeletal:        General: No swelling, tenderness, deformity or signs of injury.     Cervical back: Normal range of motion and neck supple. No rigidity. No muscular tenderness.     Right lower leg: No edema.     Left lower leg: No edema.  Lymphadenopathy:     Cervical: No cervical adenopathy.  Skin:    General: Skin is warm and dry.     Capillary Refill: Capillary refill takes less than 2 seconds.     Coloration: Skin is not jaundiced or pale.     Findings: No bruising, erythema, lesion or rash.  Neurological:     General: No focal deficit present.     Mental Status: She is alert and oriented to person, place, and time. Mental status is at baseline.     Cranial Nerves: No cranial nerve deficit.     Sensory: No sensory deficit.     Motor: No weakness.     Coordination: Coordination normal.     Gait: Gait normal.     Deep Tendon Reflexes: Reflexes normal.  Psychiatric:        Mood and Affect: Mood normal.        Behavior: Behavior normal.        Thought Content: Thought content normal.        Judgment: Judgment normal.     Results for orders placed or performed in visit on 03/19/24  WET PREP FOR TRICH, YEAST, CLUE   Collection Time: 03/19/24   9:01 AM   Specimen: Sterile Swab   Sterile Swab  Result Value Ref Range   Trichomonas Exam Negative Negative   Yeast Exam Negative Negative   Clue Cell Exam Negative Negative  Microscopic Examination   Collection Time: 03/19/24  9:01 AM   Urine  Result Value Ref Range   WBC, UA None seen 0 - 5 /hpf   RBC, Urine >30R 0 - 2 /hpf   Epithelial Cells (non renal) 0-10 0 - 10 /hpf   Mucus, UA Present (A) Not Estab.   Bacteria, UA None seen None seen/Few  Urinalysis, Routine w reflex microscopic   Collection Time: 03/19/24  9:01 AM  Result Value Ref Range   Specific Gravity, UA 1.020 1.005 - 1.030   pH, UA 7.5 5.0 - 7.5   Color, UA Yellow Yellow   Appearance Ur Cloudy (A) Clear   Leukocytes,UA Negative Negative   Protein,UA 1+ (A) Negative/Trace   Glucose, UA Negative Negative   Ketones, UA Negative Negative   RBC, UA 3+ (A) Negative   Bilirubin, UA Negative Negative   Urobilinogen, Ur 0.2 0.2 - 1.0 mg/dL   Nitrite, UA Negative Negative   Microscopic Examination See below:   Bayer DCA Hb A1c Waived   Collection Time:  03/19/24  9:01 AM  Result Value Ref Range   HB A1C (BAYER DCA - WAIVED) 5.4 4.8 - 5.6 %      Assessment & Plan:   Problem List Items Addressed This Visit       Other   Anxiety   Will try her on buspar - may need preventative medicine. Call with any concerns. Recheck 6 weeks.       Relevant Medications   busPIRone  (BUSPAR ) 5 MG tablet   Other Visit Diagnoses       Routine general medical examination at a health care facility    -  Primary   Vaccines up to date. Screenign labs checked last visit- lipids to be checked today. Pap up to date. Continue diet and exercise. Call with any concerns.   Relevant Orders   Lipid Panel w/o Chol/HDL Ratio   Hepatitis B surface antibody,quantitative   Bayer DCA Hb A1c Waived (Completed)   HIV antibody (with reflex)     Urinary retention       Will check urine- if normal and no better next time. Will refer to urology.    Relevant Orders   WET PREP FOR TRICH, YEAST, CLUE (Completed)   Urinalysis, Routine w reflex microscopic (Completed)   Urine Culture   GC/Chlamydia Probe Amp     Snoring       Significant concer for OSA given tonsillar hypertrophy. Will check sleep study and refer to ENT if needed.     Tonsillar hypertrophy       Significant concer for OSA given tonsillar hypertrophy. Will check sleep study and refer to ENT if needed.   Relevant Orders   Ambulatory referral to Sleep Studies     Abnormal menstrual periods       Will check labs and start OCP. Follow up 6 weeks. Call with any concerns.   Relevant Orders   FSH   LH   Estradiol    Testosterone , free, total([facility])   DHEA-sulfate   Prolactin        Follow up plan: Return in about 6 weeks (around 04/30/2024).   LABORATORY TESTING:  - Pap smear: up to date  IMMUNIZATIONS:   - Tdap: Tetanus vaccination status reviewed: last tetanus booster within 10 years. - Influenza: Postponed to flu season - Pneumovax: Not applicable - Prevnar: Up to date - COVID: Refused - HPV: Up to date  PATIENT COUNSELING:   Advised to take 1 mg of folate supplement per day if capable of pregnancy.   Sexuality: Discussed sexually transmitted diseases, partner selection, use of condoms, avoidance of unintended pregnancy  and contraceptive alternatives.   Advised to avoid cigarette smoking.  I discussed with the patient that most people either abstain from alcohol or drink within safe limits (<=14/week and <=4 drinks/occasion for males, <=7/weeks and <= 3 drinks/occasion for females) and that the risk for alcohol disorders and other health effects rises proportionally with the number of drinks per week and how often a drinker exceeds daily limits.  Discussed cessation/primary prevention of drug use and availability of treatment for abuse.   Diet: Encouraged to adjust caloric intake to maintain  or achieve ideal body weight, to reduce intake of  dietary saturated fat and total fat, to limit sodium intake by avoiding high sodium foods and not adding table salt, and to maintain adequate dietary potassium and calcium preferably from fresh fruits, vegetables, and low-fat dairy products.    stressed the importance of regular exercise  Injury prevention: Discussed safety belts,  safety helmets, smoke detector, smoking near bedding or upholstery.   Dental health: Discussed importance of regular tooth brushing, flossing, and dental visits.    NEXT PREVENTATIVE PHYSICAL DUE IN 1 YEAR. Return in about 6 weeks (around 04/30/2024).

## 2024-03-19 NOTE — Assessment & Plan Note (Signed)
 Will try her on buspar - may need preventative medicine. Call with any concerns. Recheck 6 weeks.

## 2024-03-20 ENCOUNTER — Ambulatory Visit: Payer: Self-pay | Admitting: Family Medicine

## 2024-03-21 LAB — HEPATITIS B SURFACE ANTIBODY, QUANTITATIVE: Hepatitis B Surf Ab Quant: 79.3 m[IU]/mL

## 2024-03-21 LAB — TESTOSTERONE, FREE, TOTAL, SHBG
Sex Hormone Binding: 36.6 nmol/L (ref 24.6–122.0)
Testosterone, Free: 0.7 pg/mL (ref 0.0–4.2)
Testosterone: 22 ng/dL (ref 13–71)

## 2024-03-21 LAB — ESTRADIOL: Estradiol: 27.4 pg/mL

## 2024-03-21 LAB — FOLLICLE STIMULATING HORMONE: FSH: 5.6 m[IU]/mL

## 2024-03-21 LAB — GC/CHLAMYDIA PROBE AMP
Chlamydia trachomatis, NAA: NEGATIVE
Neisseria Gonorrhoeae by PCR: NEGATIVE

## 2024-03-21 LAB — LIPID PANEL W/O CHOL/HDL RATIO
Cholesterol, Total: 140 mg/dL (ref 100–199)
HDL: 40 mg/dL (ref >39)
LDL Chol Calc (NIH): 85 mg/dL (ref 0–99)
Triglycerides: 75 mg/dL (ref 0–149)
VLDL Cholesterol Cal: 15 mg/dL (ref 5–40)

## 2024-03-21 LAB — PROLACTIN: Prolactin: 30.4 ng/mL (ref 4.8–33.4)

## 2024-03-21 LAB — URINE CULTURE

## 2024-03-21 LAB — DHEA-SULFATE: DHEA-SO4: 266 ug/dL (ref 110.0–431.7)

## 2024-03-21 LAB — HIV ANTIBODY (ROUTINE TESTING W REFLEX): HIV Screen 4th Generation wRfx: NONREACTIVE

## 2024-03-21 LAB — LUTEINIZING HORMONE: LH: 7.6 m[IU]/mL

## 2024-05-07 ENCOUNTER — Ambulatory Visit: Payer: Self-pay | Admitting: Family Medicine

## 2024-06-10 ENCOUNTER — Ambulatory Visit (INDEPENDENT_AMBULATORY_CARE_PROVIDER_SITE_OTHER): Payer: Self-pay | Admitting: Family Medicine

## 2024-06-10 ENCOUNTER — Encounter: Payer: Self-pay | Admitting: Family Medicine

## 2024-06-10 VITALS — BP 106/72 | HR 80 | Temp 97.6°F | Ht 63.5 in | Wt 142.0 lb

## 2024-06-10 DIAGNOSIS — N926 Irregular menstruation, unspecified: Secondary | ICD-10-CM

## 2024-06-10 DIAGNOSIS — F419 Anxiety disorder, unspecified: Secondary | ICD-10-CM

## 2024-06-10 DIAGNOSIS — N898 Other specified noninflammatory disorders of vagina: Secondary | ICD-10-CM

## 2024-06-10 LAB — WET PREP FOR TRICH, YEAST, CLUE
Clue Cell Exam: POSITIVE — AB
Trichomonas Exam: NEGATIVE
Yeast Exam: NEGATIVE

## 2024-06-10 MED ORDER — METRONIDAZOLE 500 MG PO TABS: 500.0000 mg | ORAL_TABLET | Freq: Two times a day (BID) | ORAL | 0 refills | Status: AC

## 2024-06-10 MED ORDER — NORETHINDRONE ACET-ETHINYL EST 1.5-30 MG-MCG PO TABS: 1.0000 | ORAL_TABLET | Freq: Every day | ORAL | 3 refills | Status: AC

## 2024-06-10 MED ORDER — BUSPIRONE HCL 5 MG PO TABS: 5.0000 mg | ORAL_TABLET | Freq: Two times a day (BID) | ORAL | 1 refills | Status: AC | PRN

## 2024-06-10 NOTE — Assessment & Plan Note (Signed)
 Doing much better on the buspar . Stable. Continue current regimen. Call with any concerns.

## 2024-06-10 NOTE — Progress Notes (Signed)
 BP 106/72   Pulse 80   Temp 97.6 F (36.4 C) (Oral)   Ht 5' 3.5 (1.613 m)   Wt 142 lb (64.4 kg)   LMP 05/30/2024   BMI 24.76 kg/m    Subjective:    Patient ID: Lauren Torres, female    DOB: [birth year], 23 y.o.   MRN: [id number]  HPI: Lauren Torres is a 23 y.o. female  Chief Complaint  Patient presents with   Anxiety   Contraception   ANXIETY/STRESS Duration: months Status:better Anxious mood: yes  Excessive worrying: no Irritability: no  Sweating: no Nausea: no Palpitations:no Hyperventilation: no Panic attacks: no Agoraphobia: no  Obscessions/compulsions: no Depressed mood: no    06/10/2024    8:34 AM 03/19/2024    8:32 AM 10/19/2023   12:53 PM 03/01/2023   11:08 AM  Depression screen PHQ 2/9  Decreased Interest 1 2 3 1   Down, Depressed, Hopeless 2 1 1 2   PHQ - 2 Score 3 3 4 3   Altered sleeping 3 3 3 3   Tired, decreased energy 3 1 3 2   Change in appetite 3 1 3 1   Feeling bad or failure about yourself  1 0 1 0  Trouble concentrating 1 0 3 0  Moving slowly or fidgety/restless 0 2 3 0  Suicidal thoughts 0 0 1 0  PHQ-9 Score 14 10 21 9   Difficult doing work/chores Not difficult at all Somewhat difficult Extremely dIfficult Somewhat difficult   Anhedonia: no Weight changes: no Insomnia: no   Hypersomnia: no Fatigue/loss of energy: yes Feelings of worthlessness: no Feelings of guilt: no Impaired concentration/indecisiveness: no Suicidal ideations: no  Crying spells: no Recent Stressors/Life Changes: no   Relationship problems: no   Family stress: no     Financial stress: no    Job stress: yes    Recent death/loss: no  ABNORMAL MENSTRUAL PERIODS- tried the OCP for 1 pack- was bleeding throughout for 3x  Duration: months Average interval between menses: sometimes back to back, sometimes months apart Length of menses: 2 days to 16 days Flow: all over the place Dysmenorrhea: yes Intermenstrual bleeding:yes Postcoital bleeding:  yes Contraception: condoms Menarche at age:  Sexual activity: Practices careful partner selection, always uses condoms History of sexually transmitted diseases: no History GYN procedures: no Abnormal pap smears: no   Dyspareunia: yes Vaginal discharge:yes Abdominal pain: yes Galactorrhea: no Hirsuitism: no Frequent bruising/mucosal bleeding: yes Double vision:no Hot flashes: yes    Relevant past medical, surgical, family and social history reviewed and updated as indicated. Interim medical history since our last visit reviewed. Allergies and medications reviewed and updated.  Review of Systems  Constitutional: Negative.   Respiratory: Negative.    Cardiovascular: Negative.   Genitourinary: Negative.   Musculoskeletal: Negative.   Psychiatric/Behavioral: Negative.      Per HPI unless specifically indicated above     Objective:    BP 106/72   Pulse 80   Temp 97.6 F (36.4 C) (Oral)   Ht 5' 3.5 (1.613 m)   Wt 142 lb (64.4 kg)   LMP 05/30/2024   BMI 24.76 kg/m   Wt Readings from Last 3 Encounters:  06/10/24 142 lb (64.4 kg)  03/19/24 139 lb (63 kg)  03/06/24 138 lb 3.2 oz (62.7 kg)    Physical Exam Vitals and nursing note reviewed.  Constitutional:      General: She is not in acute distress.    Appearance: Normal appearance. She is not ill-appearing, toxic-appearing or diaphoretic.  HENT:     Head: Normocephalic and atraumatic.     Right Ear: External ear normal.     Left Ear: External ear normal.     Nose: Nose normal.     Mouth/Throat:     Mouth: Mucous membranes are moist.     Pharynx: Oropharynx is clear.  Eyes:     General: No scleral icterus.       Right eye: No discharge.        Left eye: No discharge.     Extraocular Movements: Extraocular movements intact.     Conjunctiva/sclera: Conjunctivae normal.     Pupils: Pupils are equal, round, and reactive to light.  Cardiovascular:     Rate and Rhythm: Normal rate and regular rhythm.     Pulses:  Normal pulses.     Heart sounds: Normal heart sounds. No murmur heard.    No friction rub. No gallop.  Pulmonary:     Effort: Pulmonary effort is normal. No respiratory distress.     Breath sounds: Normal breath sounds. No stridor. No wheezing, rhonchi or rales.  Chest:     Chest wall: No tenderness.  Musculoskeletal:        General: Normal range of motion.     Cervical back: Normal range of motion and neck supple.  Skin:    General: Skin is warm and dry.     Capillary Refill: Capillary refill takes less than 2 seconds.     Coloration: Skin is not jaundiced or pale.     Findings: No bruising, erythema, lesion or rash.  Neurological:     General: No focal deficit present.     Mental Status: She is alert and oriented to person, place, and time. Mental status is at baseline.  Psychiatric:        Mood and Affect: Mood normal.        Behavior: Behavior normal.        Thought Content: Thought content normal.        Judgment: Judgment normal.     Results for orders placed or performed in visit on 03/19/24  WET PREP FOR TRICH, YEAST, CLUE   Collection Time: 03/19/24  9:01 AM   Specimen: Sterile Swab   Sterile Swab  Result Value Ref Range   Trichomonas Exam Negative Negative   Yeast Exam Negative Negative   Clue Cell Exam Negative Negative  Urine Culture   Collection Time: 03/19/24  9:01 AM   Specimen: Urine   UR  Result Value Ref Range   Urine Culture, Routine Final report    Organism ID, Bacteria Comment   GC/Chlamydia Probe Amp   Collection Time: 03/19/24  9:01 AM   Specimen: Urine   UR  Result Value Ref Range   Chlamydia trachomatis, NAA Negative Negative   Neisseria Gonorrhoeae by PCR Negative Negative  Microscopic Examination   Collection Time: 03/19/24  9:01 AM   Urine  Result Value Ref Range   WBC, UA None seen 0 - 5 /hpf   RBC, Urine >30R 0 - 2 /hpf   Epithelial Cells (non renal) 0-10 0 - 10 /hpf   Mucus, UA Present (A) Not Estab.   Bacteria, UA None seen  None seen/Few  Urinalysis, Routine w reflex microscopic   Collection Time: 03/19/24  9:01 AM  Result Value Ref Range   Specific Gravity, UA 1.020 1.005 - 1.030   pH, UA 7.5 5.0 - 7.5   Color, UA Yellow Yellow   Appearance Ur  Cloudy (A) Clear   Leukocytes,UA Negative Negative   Protein,UA 1+ (A) Negative/Trace   Glucose, UA Negative Negative   Ketones, UA Negative Negative   RBC, UA 3+ (A) Negative   Bilirubin, UA Negative Negative   Urobilinogen, Ur 0.2 0.2 - 1.0 mg/dL   Nitrite, UA Negative Negative   Microscopic Examination See below:   Bayer DCA Hb A1c Waived   Collection Time: 03/19/24  9:01 AM  Result Value Ref Range   HB A1C (BAYER DCA - WAIVED) 5.4 4.8 - 5.6 %  Lipid Panel w/o Chol/HDL Ratio   Collection Time: 03/19/24  9:07 AM  Result Value Ref Range   Cholesterol, Total 140 100 - 199 mg/dL   Triglycerides 75 0 - 149 mg/dL   HDL 40 >60 mg/dL   VLDL Cholesterol Cal 15 5 - 40 mg/dL   LDL Chol Calc (NIH) 85 0 - 99 mg/dL  Hepatitis B surface antibody,quantitative   Collection Time: 03/19/24  9:07 AM  Result Value Ref Range   Hepatitis B Surf Ab Quant 79.3 Immunity>10 mIU/mL  [facility]   Collection Time: 03/19/24  9:07 AM  Result Value Ref Range   FSH 5.6 mIU/mL  LH   Collection Time: 03/19/24  9:07 AM  Result Value Ref Range   LH 7.6 mIU/mL  Estradiol    Collection Time: 03/19/24  9:07 AM  Result Value Ref Range   Estradiol  27.4 pg/mL  Testosterone , free, total([facility])   Collection Time: 03/19/24  9:07 AM  Result Value Ref Range   Testosterone  22 13 - 71 ng/dL   Testosterone , Free 0.7 0.0 - 4.2 pg/mL   Sex Hormone Binding 36.6 24.6 - 122.0 nmol/L  DHEA-sulfate   Collection Time: 03/19/24  9:07 AM  Result Value Ref Range   DHEA-SO4 266.0 110.0 - 431.7 ug/dL  Prolactin   Collection Time: 03/19/24  9:07 AM  Result Value Ref Range   Prolactin 30.4 4.8 - 33.4 ng/mL  HIV antibody (with reflex)   Collection Time: 03/19/24  9:07 AM  Result Value Ref Range    HIV Screen 4th Generation wRfx Non Reactive Non Reactive      Assessment & Plan:   Problem List Items Addressed This Visit       Other   Anxiety - Primary   Doing much better on the buspar . Stable. Continue current regimen. Call with any concerns.       Relevant Medications   busPIRone  (BUSPAR ) 5 MG tablet   Other Visit Diagnoses       Abnormal menstrual periods       Did not do well with low dose OCP- will increase hormone dose and recheck in about 6 weeks. Call with any concerns.     Vaginal discharge       Relevant Orders   WET PREP FOR TRICH, YEAST, CLUE        Follow up plan: Return about 6 weeks after she starts the medicine.

## 2024-08-05 ENCOUNTER — Ambulatory Visit: Payer: Self-pay | Admitting: Family Medicine
